# Patient Record
Sex: Male | Born: 1980 | Race: White | Hispanic: No | Marital: Married | Smoking: Former smoker
Health system: Southern US, Community
[De-identification: ages and names within clinical notes are randomized; demographics above are authoritative.]

## PROBLEM LIST (undated history)

## (undated) DIAGNOSIS — F329 Major depressive disorder, single episode, unspecified: Secondary | ICD-10-CM

## (undated) DIAGNOSIS — F419 Anxiety disorder, unspecified: Secondary | ICD-10-CM

## (undated) DIAGNOSIS — F32A Depression, unspecified: Secondary | ICD-10-CM

## (undated) DIAGNOSIS — J189 Pneumonia, unspecified organism: Secondary | ICD-10-CM

## (undated) DIAGNOSIS — IMO0002 Reserved for concepts with insufficient information to code with codable children: Secondary | ICD-10-CM

## (undated) HISTORY — PX: TONSILLECTOMY: SUR1361

## (undated) HISTORY — DX: Major depressive disorder, single episode, unspecified: F32.9

## (undated) HISTORY — PX: WISDOM TOOTH EXTRACTION: SHX21

## (undated) HISTORY — DX: Depression, unspecified: F32.A

---

## 2011-05-18 ENCOUNTER — Encounter (HOSPITAL_BASED_OUTPATIENT_CLINIC_OR_DEPARTMENT_OTHER): Payer: Self-pay | Admitting: Emergency Medicine

## 2011-05-18 ENCOUNTER — Emergency Department (HOSPITAL_BASED_OUTPATIENT_CLINIC_OR_DEPARTMENT_OTHER)
Admission: EM | Admit: 2011-05-18 | Discharge: 2011-05-18 | Disposition: A | Discharge status: Home / Self Care | Payer: Worker's Compensation | Attending: Emergency Medicine | Admitting: Emergency Medicine

## 2011-05-18 DIAGNOSIS — S335XXA Sprain of ligaments of lumbar spine, initial encounter: Secondary | ICD-10-CM | POA: Insufficient documentation

## 2011-05-18 DIAGNOSIS — X500XXA Overexertion from strenuous movement or load, initial encounter: Secondary | ICD-10-CM | POA: Insufficient documentation

## 2011-05-18 DIAGNOSIS — S39012A Strain of muscle, fascia and tendon of lower back, initial encounter: Secondary | ICD-10-CM

## 2011-05-18 DIAGNOSIS — Y9210 Unspecified residential institution as the place of occurrence of the external cause: Secondary | ICD-10-CM | POA: Insufficient documentation

## 2011-05-18 HISTORY — DX: Reserved for concepts with insufficient information to code with codable children: IMO0002

## 2011-05-18 MED ORDER — HYDROCODONE-ACETAMINOPHEN 5-500 MG PO TABS: 1.0000 | ORAL_TABLET | Freq: Four times a day (QID) | ORAL | Status: AC | PRN

## 2011-05-18 MED ORDER — IBUPROFEN 600 MG PO TABS: 600.0000 mg | ORAL_TABLET | Freq: Four times a day (QID) | ORAL | Status: AC | PRN

## 2011-05-18 MED ORDER — IBUPROFEN 400 MG PO TABS
600.0000 mg | ORAL_TABLET | Freq: Once | ORAL | Status: DC
  Filled 2011-05-18: qty 1

## 2011-05-18 NOTE — ED Notes (Signed)
Back pain after lifting a heavy pt  500+ lbs

## 2011-05-18 NOTE — ED Notes (Signed)
Pt states that he took 800mg  ibuprofen around 1:30 am

## 2011-05-18 NOTE — ED Provider Notes (Signed)
History     CSN: [id number]  Arrival date & time 05/18/11  0218   First MD Initiated Contact with Patient 05/18/11 0319      Chief Complaint  Patient presents with  . Back Pain    (Consider location/radiation/quality/duration/timing/severity/associated sxs/prior treatment) Patient is a 31 y.o. male presenting with back pain. The history is provided by the patient.  Back Pain  Pertinent negatives include no fever, no numbness and no weakness.  at work tonight, lifting heavy pt from stretcher, c/o low back soreness/stiffness developing in low back since lifting injury. No radicular pain. No leg numbness/weakness. No abd pain. No hx ddd or chronic back pain, although remote hx transverse process fx. No fever. No problems walking. No gu c/o.   Past Medical History  Diagnosis Date  . Back fracture   . Migraine     Past Surgical History  Procedure Date  . Tonsillectomy     Family History  Problem Relation Age of Onset  . Migraines Mother   . Hypertension Mother   . Stroke Mother   . Thyroid disease Father     History  Substance Use Topics  . Smoking status: Not on file  . Smokeless tobacco: Not on file  . Alcohol Use: No      Review of Systems  Constitutional: Negative for fever.  HENT: Negative for neck pain.   Musculoskeletal: Positive for back pain.  Neurological: Negative for weakness and numbness.    Allergies  Erythromycin and Grape seed  Home Medications  No current outpatient prescriptions on file.  BP 119/81  Pulse 84  Temp(Src) 98.6 F (37 C) (Oral)  Resp 18  Ht 6' (1.829 m)  Wt 174 lb (78.926 kg)  BMI 23.60 kg/m2  SpO2 99%  Physical Exam  Nursing note and vitals reviewed. Constitutional: He is oriented to person, place, and time. He appears well-developed and well-nourished. No distress.  HENT:  Head: Atraumatic.  Neck: Neck supple. No tracheal deviation present.  Cardiovascular: Normal rate.   Pulmonary/Chest: Effort normal. No  accessory muscle usage. No respiratory distress.  Abdominal: Soft. There is no tenderness.  Genitourinary:       No cva tenderness  Musculoskeletal: Normal range of motion.       Spine non tender, aligned, no step off. Lumbar musclar tenderness bil.   Neurological: He is alert and oriented to person, place, and time.       Motor intact bil. Straight leg raise neg. Steady gait.   Skin: Skin is warm and dry.  Psychiatric: He has a normal mood and affect.    ED Course  Procedures (including critical care time)    MDM  Motrin po. Spine nt. Exam c/w muscle strain. No radicular symptoms.         Suzi Roots, MD 05/18/11 [phone]

## 2011-05-18 NOTE — ED Notes (Signed)
MD at bedside. 

## 2011-05-18 NOTE — Discharge Instructions (Signed)
Take motrin as need. You may take vicodin as need for pain. No driving when taking vicodin. Also, do not take tylenol or acetaminophen containing medication when taking vicodin. Avoid bending at waist or heavy lifting more than 20 lbs for the next few days. Try heat/heating pad to sore area. Follow up with primary care doctor in coming week for recheck - discuss referral to back specialist and/or further workup if symptoms fail to improve/resolve. Return to ER if worse, leg numbness/weakness, intractable pain, other concern.   Lumbosacral Strain Lumbosacral strain is one of the most common causes of back pain. There are many causes of back pain. Most are not serious conditions. CAUSES  Your backbone (spinal column) is made up of 24 main vertebral bodies, the sacrum, and the coccyx. These are held together by muscles and tough, fibrous tissue (ligaments). Nerve roots pass through the openings between the vertebrae. A sudden move or injury to the back may cause injury to, or pressure on, these nerves. This may result in localized back pain or pain movement (radiation) into the buttocks, down the leg, and into the foot. Sharp, shooting pain from the buttock down the back of the leg (sciatica) is frequently associated with a ruptured (herniated) disk. Pain may be caused by muscle spasm alone. Your caregiver can often find the cause of your pain by the details of your symptoms and an exam. In some cases, you may need tests (such as X-rays). Your caregiver will work with you to decide if any tests are needed based on your specific exam. HOME CARE INSTRUCTIONS   Avoid an underactive lifestyle. Active exercise, as directed by your caregiver, is your greatest weapon against back pain.   Avoid hard physical activities (tennis, racquetball, waterskiing) if you are not in proper physical condition for it. This may aggravate or create problems.   If you have a back problem, avoid sports requiring sudden body  movements. Swimming and walking are generally safer activities.   Maintain good posture.   Avoid becoming overweight (obese).   Use bed rest for only the most extreme, sudden (acute) episode. Your caregiver will help you determine how much bed rest is necessary.   For acute conditions, you may put ice on the injured area.   Put ice in a plastic bag.   Place a towel between your skin and the bag.   Leave the ice on for 15 to 20 minutes at a time, every 2 hours, or as needed.   After you are improved and more active, it may help to apply heat for 30 minutes before activities.  See your caregiver if you are having pain that lasts longer than expected. Your caregiver can advise appropriate exercises or therapy if needed. With conditioning, most back problems can be avoided. SEEK IMMEDIATE MEDICAL CARE IF:   You have numbness, tingling, weakness, or problems with the use of your arms or legs.   You experience severe back pain not relieved with medicines.   There is a change in bowel or bladder control.   You have increasing pain in any area of the body, including your belly (abdomen).   You notice shortness of breath, dizziness, or feel faint.   You feel sick to your stomach (nauseous), are throwing up (vomiting), or become sweaty.   You notice discoloration of your toes or legs, or your feet get very cold.   Your back pain is getting worse.   You have a fever.  MAKE SURE YOU:  Understand these instructions.   Will watch your condition.   Will get help right away if you are not doing well or get worse.  Document Released: 12/13/2004 Document Revised: 11/15/2010 Document Reviewed: 06/04/2008 [facility] Patient Information 2012 [place], [place].

## 2011-05-27 ENCOUNTER — Encounter (HOSPITAL_COMMUNITY): Payer: Self-pay | Admitting: *Deleted

## 2011-05-27 ENCOUNTER — Emergency Department (HOSPITAL_COMMUNITY): Payer: 59

## 2011-05-27 ENCOUNTER — Emergency Department (HOSPITAL_COMMUNITY)
Admission: EM | Admit: 2011-05-27 | Discharge: 2011-05-27 | Disposition: A | Discharge status: Home / Self Care | Payer: 59 | Attending: Emergency Medicine | Admitting: Emergency Medicine

## 2011-05-27 DIAGNOSIS — R109 Unspecified abdominal pain: Secondary | ICD-10-CM | POA: Insufficient documentation

## 2011-05-27 DIAGNOSIS — R197 Diarrhea, unspecified: Secondary | ICD-10-CM | POA: Insufficient documentation

## 2011-05-27 DIAGNOSIS — R112 Nausea with vomiting, unspecified: Secondary | ICD-10-CM

## 2011-05-27 LAB — CBC
HCT: 42.9 % (ref 39.0–52.0)
Hemoglobin: 15 g/dL (ref 13.0–17.0)
RDW: 12.1 % (ref 11.5–15.5)
WBC: 10.2 10*3/uL (ref 4.0–10.5)

## 2011-05-27 LAB — LIPASE, BLOOD: Lipase: 30 U/L (ref 11–59)

## 2011-05-27 LAB — COMPREHENSIVE METABOLIC PANEL
ALT: 12 U/L (ref 0–53)
Albumin: 4.4 g/dL (ref 3.5–5.2)
Alkaline Phosphatase: 80 U/L (ref 39–117)
BUN: 14 mg/dL (ref 6–23)
Chloride: 102 mEq/L (ref 96–112)
Potassium: 3.8 mEq/L (ref 3.5–5.1)
Sodium: 139 mEq/L (ref 135–145)
Total Bilirubin: 0.5 mg/dL (ref 0.3–1.2)

## 2011-05-27 MED ORDER — ONDANSETRON HCL 4 MG/2ML IJ SOLN
4.0000 mg | Freq: Once | INTRAMUSCULAR | Status: AC
  Administered 2011-05-27: 4 mg via INTRAVENOUS

## 2011-05-27 MED ORDER — PROMETHAZINE HCL 25 MG/ML IJ SOLN
25.0000 mg | Freq: Once | INTRAMUSCULAR | Status: AC
  Administered 2011-05-27: 25 mg via INTRAVENOUS
  Filled 2011-05-27: qty 1

## 2011-05-27 MED ORDER — PROMETHAZINE HCL 25 MG PO TABS: 25.0000 mg | ORAL_TABLET | Freq: Four times a day (QID) | ORAL | Status: DC | PRN

## 2011-05-27 MED ORDER — ONDANSETRON HCL 4 MG/2ML IJ SOLN: 4.0000 mg | INTRAMUSCULAR | Status: DC | PRN

## 2011-05-27 MED ORDER — ONDANSETRON HCL 4 MG/2ML IJ SOLN
INTRAMUSCULAR | Status: AC
  Filled 2011-05-27: qty 2

## 2011-05-27 MED ORDER — FAMOTIDINE IN NACL 20-0.9 MG/50ML-% IV SOLN
20.0000 mg | Freq: Once | INTRAVENOUS | Status: AC
  Administered 2011-05-27: 20 mg via INTRAVENOUS
  Filled 2011-05-27: qty 50

## 2011-05-27 MED ORDER — SODIUM CHLORIDE 0.9 % IV BOLUS (SEPSIS)
500.0000 mL | Freq: Once | INTRAVENOUS | Status: AC
  Administered 2011-05-27: 500 mL via INTRAVENOUS

## 2011-05-27 MED ORDER — IOHEXOL 300 MG/ML  SOLN
100.0000 mL | Freq: Once | INTRAMUSCULAR | Status: AC | PRN
  Administered 2011-05-27: 100 mL via INTRAVENOUS

## 2011-05-27 MED ORDER — SODIUM CHLORIDE 0.9 % IV SOLN
INTRAVENOUS | Status: DC
  Administered 2011-05-27: 150 mL via INTRAVENOUS

## 2011-05-27 MED ORDER — ACETAMINOPHEN 325 MG PO TABS
650.0000 mg | ORAL_TABLET | Freq: Once | ORAL | Status: AC
  Administered 2011-05-27: 650 mg via ORAL
  Filled 2011-05-27: qty 2

## 2011-05-27 MED ORDER — SODIUM CHLORIDE 0.9 % IV BOLUS (SEPSIS)
1000.0000 mL | Freq: Once | INTRAVENOUS | Status: AC
  Administered 2011-05-27: 1000 mL via INTRAVENOUS

## 2011-05-27 NOTE — Discharge Instructions (Signed)
RESOURCE GUIDE  Dental Problems  Patients with Medicaid: [facility]                     [facility] [address]                                           [address] Phone:  [phone]                                                  Phone:  [phone]  If unable to pay or uninsured, contact:  [facility] or [facility] to become qualified for the adult dental clinic.  Chronic Pain Problems Contact [facility]  [phone] Patients need to be referred by their primary care doctor.  Insufficient Money for Medicine Contact [employer]:  call "211" or [facility] 271-5999.  No Primary Care Doctor Call [facility]  832-8000 Other agencies that provide inexpensive medical care    [facility]  [phone]    [facility]  [phone]    [facility]  [phone]    Women[facility] Clinic  [phone]    [facility]  [phone]    [facility]  [phone]  Psychological Services [facility]  [phone] [facility]  [phone] [facility]   800 853-5163 (emergency services 641-4993)  Substance Abuse Resources [facility]  336-882-2125 [facility] 336-784-9470 [facility] 336-285-9073 [facility] 336-845-3988 Residential & Outpatient Substance Abuse Program  800-659-3381  Abuse/Neglect [place] Child Abuse Hotline (336) 641-3795 [place] Child Abuse Hotline 800-378-5315 (After Hours)  Emergency Shelter [facility] (336) 271-5985  Maternity Homes [facility] (336) 275-9566 [facility] (704) 372-4663  MRSA Hotline #:   832-7006    [place] [facility]  [facility] [place] [facility]     [employer]                          [facility] [address] [place]                       [address]      [address]  [place]                                                [place]                            [place] Phone:  [phone]                                   Phone:  [phone]                 Phone:  [phone]  [facility] Phone:  [phone]    [facility] Child Abuse Hotline (209)042-1831 551-479-4395 (After Hours)   Take the prescriptions as directed.  Increase your fluid intake (ie:  Gatoraide) for the next few days, as discussed.  Eat a bland diet and advance to your regular diet slowly as you can tolerate it.   Avoid full strength juices, as well as milk and milk products until your diarrhea has resolved.   Take over the counter extra strength tylenol, as directed on packaging, as needed for discomfort or fever.  Call your regular medical doctor tomorrow morning to schedule a follow up appointment this week.  Return to the Emergency Department immediately if not improving (or even worsening) despite taking the medicines as prescribed, any black or bloody stool or vomit, or for any other concerns.

## 2011-05-27 NOTE — ED Notes (Signed)
Pt to CT and returns, now back in bed sleeping

## 2011-05-27 NOTE — ED Provider Notes (Signed)
History     CSN: [id number]  Arrival date & time 05/27/11  0544   First MD Initiated Contact with Patient 05/27/11 [phone]      Chief Complaint  Patient presents with  . Nausea     HPI Pt was seen at 0715.  Per pt, c/o gradual onset and persistence of multiple intermittent episodes of N/V that began PTA.  Has been assoc with generalized abd "pain."  Denies fevers, no diarrhea, no rash, no back pain, no black or blood in stools or emesis.   Past Medical History  Diagnosis Date  . Back fracture   . Migraine     Past Surgical History  Procedure Date  . Tonsillectomy     Family History  Problem Relation Age of Onset  . Migraines Mother   . Hypertension Mother   . Stroke Mother   . Thyroid disease Father     History  Substance Use Topics  . Smoking status: Not on file  . Smokeless tobacco: Not on file  . Alcohol Use: No    Review of Systems ROS: Statement: All systems negative except as marked or noted in the HPI; Constitutional: Negative for fever and chills. ; ; Eyes: Negative for eye pain, redness and discharge. ; ; ENMT: Negative for ear pain, hoarseness, nasal congestion, sinus pressure and sore throat. ; ; Cardiovascular: Negative for chest pain, palpitations, diaphoresis, dyspnea and peripheral edema. ; ; Respiratory: Negative for cough, wheezing and stridor. ; ; Gastrointestinal: +abd pain, N/V.  Negative for diarrhea, blood in stool, hematemesis, jaundice and rectal bleeding. . ; ; Genitourinary: Negative for dysuria, flank pain and hematuria. ; ; Musculoskeletal: Negative for back pain and neck pain. Negative for swelling and trauma.; ; Skin: Negative for pruritus, rash, abrasions, blisters, bruising and skin lesion.; ; Neuro: Negative for headache, lightheadedness and neck stiffness. Negative for weakness, altered level of consciousness , altered mental status, extremity weakness, paresthesias, involuntary movement, seizure and syncope.     Allergies  Erythromycin  and Grape seed  Home Medications   Current Outpatient Rx  Name Route Sig Dispense Refill  . HYDROCODONE-ACETAMINOPHEN 5-500 MG PO TABS Oral Take 1-2 tablets by mouth every 6 (six) hours as needed for pain. 15 tablet 0  . IBUPROFEN 600 MG PO TABS Oral Take 1 tablet (600 mg total) by mouth every 6 (six) hours as needed for pain. 20 tablet 0    BP 113/45  Pulse 107  Temp(Src) 99.7 F (37.6 C) (Oral)  Resp 20  SpO2 95%  Physical Exam 0720: Physical examination:  Nursing notes reviewed; Vital signs and O2 SAT reviewed;  Constitutional: Well developed, Well nourished, Well hydrated, In no acute distress; Head:  Normocephalic, atraumatic; Eyes: EOMI, PERRL, No scleral icterus; ENMT: Mouth and pharynx normal, Mucous membranes moist; Neck: Supple, Full range of motion, No lymphadenopathy; Cardiovascular: Regular rate and rhythm, No murmur, rub, or gallop; Respiratory: Breath sounds clear & equal bilaterally, No rales, rhonchi, wheezes, or rub, Normal respiratory effort/excursion; Chest: Nontender, Movement normal; Abdomen: Soft, +diffuse tenderness to palp esp mid-epigastric/LUQ abd, Nondistended, Normal bowel sounds; Extremities: Pulses normal, No tenderness, No edema, No calf edema or asymmetry.; Neuro: AA&Ox3, Major CN grossly intact.  No gross focal motor or sensory deficits in extremities.; Skin: Color normal, Warm, Dry, no rash.    ED Course  Procedures   MDM  MDM Reviewed: nursing note and vitals Interpretation: labs and CT scan   Results for orders placed during the hospital encounter of 05/27/11  CBC      Component Value Range   WBC 10.2  4.0 - 10.5 (K/uL)   RBC 4.90  4.22 - 5.81 (MIL/uL)   Hemoglobin 15.0  13.0 - 17.0 (g/dL)   HCT 14.7  82.9 - 56.2 (%)   MCV 87.6  78.0 - 100.0 (fL)   MCH 30.6  26.0 - 34.0 (pg)   MCHC 35.0  30.0 - 36.0 (g/dL)   RDW 13.0  86.5 - 78.4 (%)   Platelets 246  150 - 400 (K/uL)  COMPREHENSIVE METABOLIC PANEL      Component Value Range   Sodium  139  135 - 145 (mEq/L)   Potassium 3.8  3.5 - 5.1 (mEq/L)   Chloride 102  96 - 112 (mEq/L)   CO2 29  19 - 32 (mEq/L)   Glucose, Bld 116 (*) 70 - 99 (mg/dL)   BUN 14  6 - 23 (mg/dL)   Creatinine, Ser 6.96  0.50 - 1.35 (mg/dL)   Calcium 8.9  8.4 - 29.5 (mg/dL)   Total Protein 7.2  6.0 - 8.3 (g/dL)   Albumin 4.4  3.5 - 5.2 (g/dL)   AST 14  0 - 37 (U/L)   ALT 12  0 - 53 (U/L)   Alkaline Phosphatase 80  39 - 117 (U/L)   Total Bilirubin 0.5  0.3 - 1.2 (mg/dL)   GFR calc non Af Amer >90  >90 (mL/min)   GFR calc Af Amer >90  >90 (mL/min)  LIPASE, BLOOD      Component Value Range   Lipase 30  11 - 59 (U/L)   Ct Abdomen Pelvis W Contrast 05/27/2011  *RADIOLOGY REPORT*  Clinical Data: Nausea, vomiting and abdominal pain.  CT ABDOMEN AND PELVIS WITH CONTRAST  Technique:  Multidetector CT imaging of the abdomen and pelvis was performed following the standard protocol during bolus administration of intravenous contrast.  Contrast: OMNIPAQUE IOHEXOL 300 MG/ML IJ SOLN  Comparison: None.  Findings: Nonspecific fluid in multiple nondilated small bowel loops in the proximal half of the colon.  This may reflect some type of underlying enteritis.  No associated bowel wall thickening, perforation, free fluid or abscess.  The liver, gallbladder, pancreas, spleen, adrenal glands and kidneys are within normal limits.  No masses or enlarged lymph nodes are identified.  No hernias or abnormal calcifications.  The bladder is moderately distended and unremarkable in appearance.  No bony abnormalities.  IMPRESSION: The only finding by CT is nonspecific fluid in nondilated small bowel and colon.  This may be reflective of underlying enteritis.  Original Report Authenticated By: Reola Calkins, M.D.     11:52 AM:   States he still feels "groggy" from the phenergan dose given earlier.  Fever tx with APAP.  Pt has tol PO well while in ED without N/V.  No stooling while in ED.  Pt wants to go home now.  Appears viral  illness at this time, as pt does not have hx of exposure to contaminated water/foods, recent travel, recent abx, or bloody stools.  Call to Rads MD to further discuss CT above: confirms that there is NOT dilated nor bowel wall thickening, only increased amount of fluid in the small bowel, which is a non-specific finding and generally seen with gastroenteritis.  Dx testing d/w pt.  Questions answered.  Verb understanding, agreeable to d/c home with outpt f/u.            Laray Anger, DO 05/28/11 1219

## 2011-05-27 NOTE — ED Notes (Signed)
[id number]<EX> Expected date:<BR> Expected time:<BR> Means of arrival:<BR> Comments:<BR> EMS, EMS

## 2011-05-27 NOTE — ED Notes (Signed)
Pt started vomiting x 30 minutes prior to arrival.  Pt has vomited x 3.

## 2011-05-27 NOTE — ED Notes (Signed)
Put patient into gown.

## 2013-04-26 ENCOUNTER — Emergency Department (HOSPITAL_COMMUNITY)
Admission: EM | Admit: 2013-04-26 | Discharge: 2013-04-26 | Disposition: A | Discharge status: Home / Self Care | Payer: Worker's Compensation | Attending: Emergency Medicine | Admitting: Emergency Medicine

## 2013-04-26 ENCOUNTER — Encounter (HOSPITAL_COMMUNITY): Payer: Self-pay | Admitting: Emergency Medicine

## 2013-04-26 DIAGNOSIS — Z8781 Personal history of (healed) traumatic fracture: Secondary | ICD-10-CM | POA: Diagnosis not present

## 2013-04-26 DIAGNOSIS — G43909 Migraine, unspecified, not intractable, without status migrainosus: Secondary | ICD-10-CM | POA: Diagnosis not present

## 2013-04-26 DIAGNOSIS — Y9210 Unspecified residential institution as the place of occurrence of the external cause: Secondary | ICD-10-CM | POA: Diagnosis not present

## 2013-04-26 DIAGNOSIS — Y9389 Activity, other specified: Secondary | ICD-10-CM | POA: Diagnosis not present

## 2013-04-26 DIAGNOSIS — Z79899 Other long term (current) drug therapy: Secondary | ICD-10-CM | POA: Insufficient documentation

## 2013-04-26 DIAGNOSIS — S51809A Unspecified open wound of unspecified forearm, initial encounter: Secondary | ICD-10-CM | POA: Insufficient documentation

## 2013-04-26 DIAGNOSIS — Z87891 Personal history of nicotine dependence: Secondary | ICD-10-CM | POA: Diagnosis not present

## 2013-04-26 DIAGNOSIS — Z7721 Contact with and (suspected) exposure to potentially hazardous body fluids: Secondary | ICD-10-CM | POA: Diagnosis present

## 2013-04-26 DIAGNOSIS — Y990 Civilian activity done for income or pay: Secondary | ICD-10-CM | POA: Insufficient documentation

## 2013-04-26 NOTE — Discharge Instructions (Signed)
Body Fluid Exposure Information  People may come into contact with blood and other body fluids under various circumstances. In some cases, body fluids may contain germs (bacteria or viruses) that cause infections. These germs can be spread when another person's body fluids come into contact with your skin, mouth, eyes, or genitals.   Exposure to body fluids that may contain infectious material is a common problem for people providing care for others who are ill. It can occur when a person is performing health care tasks in the workplace or when taking care of a family member at home. Other common methods of exposure include injection drug use, sharing needles, and sexual activity.  The risk of an infection spreading through body fluid exposure is small and depends on a variety of factors. This includes the type of body fluid, the nature of the exposure, and the health status of the person who was the source of the body fluids. Your health care provider can help you assess the risk.  WHAT TYPES OF BODY FLUID CAN SPREAD INFECTION?  The following types of body fluid have the potential to spread infections:  · Blood.  · Semen.  · Vaginal secretions.  · Urine.  · Feces.  · Saliva.  · Nasal or eye discharge.  · Breast milk.  · Amniotic fluid and fluids surrounding body organs.  WHAT ARE SOME FIRST-AID MEASURES FOR BODY FLUID EXPOSURE?  The following steps should be taken as soon as possible after a person is exposed to body fluids:  Intact Skin  · For contact with closed skin, wash the area with soap and water.  Broken Skin  · For contact with broken skin (a wound), wash the area with soap and water. Let the area bleed a little. Then place a bandage or clean towel on the wound, applying gentle pressure to stop the bleeding. Do not squeeze or rub the area.  · Use just water or hand sanitizer if a sink with soap is not available.  · Do not use harsh chemicals such as bleach or iodine.  Eyes  · Rinse the eyes with water or  saline for 30 seconds.  · If the person is wearing contact lenses, leave the contact lenses in while rinsing the eyes. Once the rinsing is complete, remove the contact lenses.  Mouth  · Spit out the fluids. Rinse and spit with water 4 5 times.  In addition, you should remove any clothing that comes into contact with body fluids. However, if body fluid exposure results from sexual assault, seek medical care immediately without changing clothes or bathing.  WHEN SHOULD YOU SEEK HELP?  After performing the proper first-aid steps, you should contact your health care provider or seek emergency care right away if blood or other body fluids made contact with areas of broken skin or openings such as the eyes or mouth. If the exposure to body fluid happened in the workplace, you should report it to your work supervisor immediately. Many workplaces have procedures in place for exposure situations.  WHAT WILL HAPPEN AFTER YOU REPORT THE EXPOSURE?  Your health care provider will ask you several questions. Information requested may include:  · Your medical history, including vaccination records.  · Date and time of the exposure.  · Whether you saw body fluids during the exposure.  · Type of body fluid you were exposed to.  · Volume of body fluid you were exposed to.  · How the exposure happened.  · If any   devices, such as a needle, were being used.  · Which area of your body made contact with the body fluid.  · Description of any injury to the skin or other area.  · How long contact was made with the body fluid.  · Any information you have about the health status of the person whose body fluid you were exposed to.  The health care provider will assess your risk of infection. Often, no treatment is necessary. In some cases, the health care provider may recommend doing blood tests right away. Follow-up blood tests may also be done at certain intervals during the upcoming weeks and months to check for changes. You may be offered  treatment to prevent an infection from developing after exposure (post-exposure prophylaxis, PEP). This may include certain vaccinations or medicines and may be necessary when there is a risk of a serious infection, such as HIV or hepatitis B. Your health care provider should discuss appropriate treatment and vaccinations with you.  HOW CAN YOU PREVENT EXPOSURE AND INFECTION?  Always remember that prevention is the first line of defense against body fluid exposure. To help prevent exposure to body fluids:  · Wash and disinfect countertops and other surfaces regularly.  · Wear appropriate protective gear such as gloves, gowns, or eyewear when the possibility of exposure is present.  · Wipe away spills of body fluid with disposable towels.  · Properly dispose of blood products and other fluids. Use secured bags.  · Properly dispose of needles and other instruments with sharp points or edges (sharps). Use closed, marked containers.  · Avoid injection drug use.  · Do not share needles.  · Avoid recapping needles.  · Use a condom during sexual intercourse.  · Make sure you learn and follow any guidelines for preventing exposure (universal precautions) provided at your workplace.  To help reduce your chances of getting an infection:  · Make sure your vaccinations are up-to-date, including those for tetanus and hepatitis.  · Wash your hands frequently with soap and water. Use hand sanitizers.  · Avoid having multiple sex partners.  · Follow up with your health care provider as directed after being evaluated for an exposure to body fluids.  To avoid spreading infection to others:  · Do not have sexual relations until you know you are free of infection.  · Do not donate blood, plasma, breast milk, sperm, or other body fluids.  · Do not share hygiene tools such as toothbrushes, razors, or dental floss.  · Keep open wounds covered.  · Dispose of any items with blood on them (razors, tampons, bandages) by putting them in the  trash.  · Do not share drug supplies with others, such as needles, syringes, straws, or pipes.  · Follow all of your health care provider's instructions for preventing the spread of infection.  Document Released: 11/05/2012 Document Reviewed: 11/05/2012  [employer]® Patient Information ©2014 [employer].

## 2013-04-26 NOTE — ED Provider Notes (Signed)
CSN: [id number]     Arrival date & time 04/26/13  2117 History   First MD Initiated Contact with Patient 04/26/13 2147     Chief Complaint  Patient presents with  . Body Fluid Exposure   (Consider location/radiation/quality/duration/timing/severity/associated sxs/prior Treatment) HPI Comments: Patient is 33 year old [address] [employer] employee who while bringing in a combative patient got scratched on his right forearm by the patient.  He then states that they lost the IV and while pulling it the patient again became combative and then bled over the scratches on his arm.  He washed the area immediately with soap and water.  The source patient is available here for testing.    The history is provided by the patient. No language interpreter was used.    Past Medical History  Diagnosis Date  . Back fracture   . Migraine    Past Surgical History  Procedure Laterality Date  . Tonsillectomy     Family History  Problem Relation Age of Onset  . Migraines Mother   . Hypertension Mother   . Stroke Mother   . Thyroid disease Father    History  Substance Use Topics  . Smoking status: Former Smoker -- 15 years  . Smokeless tobacco: Not on file  . Alcohol Use: No    Review of Systems  All other systems reviewed and are negative.    Allergies  Grape seed and Erythromycin  Home Medications   Current Outpatient Rx  Name  Route  Sig  Dispense  Refill  . aspirin-acetaminophen-caffeine (EXCEDRIN MIGRAINE) 250-250-65 MG per tablet   Oral   Take 2 tablets by mouth every 6 (six) hours as needed for headache.         . loratadine (CLARITIN) 10 MG tablet   Oral   Take 10 mg by mouth daily as needed for allergies.         . promethazine (PHENERGAN) 25 MG tablet   Oral   Take 25 mg by mouth every 6 (six) hours as needed for nausea or vomiting.         Marland Kitchen tetrahydrozoline 0.05 % ophthalmic solution   Both Eyes   Place 2 drops into both eyes daily as needed (for dry eyes).         . EXPIRED: promethazine (PHENERGAN) 25 MG tablet   Oral   Take 1 tablet (25 mg total) by mouth every 6 (six) hours as needed for nausea.   8 tablet   0    BP 144/87  Pulse 115  Temp(Src) 98.3 F (36.8 C) (Oral)  Resp 18  SpO2 96% Physical Exam  Nursing note and vitals reviewed. Constitutional: He is oriented to person, place, and time. He appears well-developed and well-nourished. No distress.  HENT:  Head: Normocephalic and atraumatic.  Right Ear: External ear normal.  Left Ear: External ear normal.  Nose: Nose normal.  Mouth/Throat: Oropharynx is clear and moist. No oropharyngeal exudate.  Eyes: Conjunctivae are normal. Pupils are equal, round, and reactive to light. No scleral icterus.  Neck: Normal range of motion. Neck supple.  Cardiovascular: Normal rate, regular rhythm and normal heart sounds.  Exam reveals no gallop and no friction rub.   No murmur heard. Pulmonary/Chest: Effort normal and breath sounds normal. No respiratory distress. He has no wheezes. He has no rales. He exhibits no tenderness.  Abdominal: Soft. Bowel sounds are normal. He exhibits no distension. There is no tenderness.  Musculoskeletal: Normal range of motion. He  exhibits no edema and no tenderness.  Lymphadenopathy:    He has no cervical adenopathy.  Neurological: He is alert and oriented to person, place, and time. He exhibits normal muscle tone. Coordination normal.  Skin: Skin is warm and dry. No rash noted. There is erythema. No pallor.  Three small scratch marks to right forearm, hemostatic, no drainage.  Psychiatric: He has a normal mood and affect. His behavior is normal. Judgment and thought content normal.    ED Course  Procedures (including critical care time) Labs Review Labs Reviewed - No data to display Imaging Review No results found.  EKG Interpretation   None      Results for orders placed during the hospital encounter of 05/27/11  CBC      Result Value Range    WBC 10.2  4.0 - 10.5 K/uL   RBC 4.90  4.22 - 5.81 MIL/uL   Hemoglobin 15.0  13.0 - 17.0 g/dL   HCT 16.142.9  09.639.0 - 04.552.0 %   MCV 87.6  78.0 - 100.0 fL   MCH 30.6  26.0 - 34.0 pg   MCHC 35.0  30.0 - 36.0 g/dL   RDW 40.912.1  81.111.5 - 91.415.5 %   Platelets 246  150 - 400 K/uL  COMPREHENSIVE METABOLIC PANEL      Result Value Range   Sodium 139  135 - 145 mEq/L   Potassium 3.8  3.5 - 5.1 mEq/L   Chloride 102  96 - 112 mEq/L   CO2 29  19 - 32 mEq/L   Glucose, Bld 116 (*) 70 - 99 mg/dL   BUN 14  6 - 23 mg/dL   Creatinine, Ser 7.820.98  0.50 - 1.35 mg/dL   Calcium 8.9  8.4 - 95.610.5 mg/dL   Total Protein 7.2  6.0 - 8.3 g/dL   Albumin 4.4  3.5 - 5.2 g/dL   AST 14  0 - 37 U/L   ALT 12  0 - 53 U/L   Alkaline Phosphatase 80  39 - 117 U/L   Total Bilirubin 0.5  0.3 - 1.2 mg/dL   GFR calc non Af Amer >90  >90 mL/min   GFR calc Af Amer >90  >90 mL/min  LIPASE, BLOOD      Result Value Range   Lipase 30  11 - 59 U/L   No results found.   MDM  Blood and body fluid exposure  Patient here with [facility] s/p blood exposure to open wound.  Rapid HIV results on source patient is negative and the patient does not have risk factors for HIV.  Based on the protocol the patient here will not be given prophylaxis medications but will follow up with their occupational health services [facility].  Izola PriceFrances C. Marisue HumbleSanford, PA-C 04/26/13 2249

## 2013-04-26 NOTE — ED Notes (Signed)
Patient with abraision to right forearm during EMS call.  Patient blood came into contact with non intact skin.

## 2013-04-27 NOTE — ED Provider Notes (Signed)
Medical screening examination/treatment/procedure(s) were performed by non-physician practitioner and as supervising physician I was immediately available for consultation/collaboration.  EKG Interpretation   None         Junius ArgyleForrest S Skylarr Liz, MD 04/27/13 1347

## 2013-10-21 ENCOUNTER — Ambulatory Visit (INDEPENDENT_AMBULATORY_CARE_PROVIDER_SITE_OTHER): Payer: 59 | Admitting: Surgery

## 2014-07-08 ENCOUNTER — Emergency Department (HOSPITAL_COMMUNITY): Payer: BLUE CROSS/BLUE SHIELD

## 2014-07-08 ENCOUNTER — Encounter (HOSPITAL_COMMUNITY): Payer: Self-pay | Admitting: Emergency Medicine

## 2014-07-08 ENCOUNTER — Emergency Department (HOSPITAL_COMMUNITY)
Admission: EM | Admit: 2014-07-08 | Discharge: 2014-07-08 | Disposition: A | Discharge status: Home / Self Care | Payer: BLUE CROSS/BLUE SHIELD | Attending: Emergency Medicine | Admitting: Emergency Medicine

## 2014-07-08 DIAGNOSIS — W230XXA Caught, crushed, jammed, or pinched between moving objects, initial encounter: Secondary | ICD-10-CM | POA: Diagnosis not present

## 2014-07-08 DIAGNOSIS — G43909 Migraine, unspecified, not intractable, without status migrainosus: Secondary | ICD-10-CM | POA: Diagnosis not present

## 2014-07-08 DIAGNOSIS — S61212A Laceration without foreign body of right middle finger without damage to nail, initial encounter: Secondary | ICD-10-CM

## 2014-07-08 DIAGNOSIS — Z8781 Personal history of (healed) traumatic fracture: Secondary | ICD-10-CM | POA: Insufficient documentation

## 2014-07-08 DIAGNOSIS — Z87891 Personal history of nicotine dependence: Secondary | ICD-10-CM | POA: Diagnosis not present

## 2014-07-08 DIAGNOSIS — Y9389 Activity, other specified: Secondary | ICD-10-CM | POA: Insufficient documentation

## 2014-07-08 DIAGNOSIS — Z23 Encounter for immunization: Secondary | ICD-10-CM | POA: Insufficient documentation

## 2014-07-08 DIAGNOSIS — Y998 Other external cause status: Secondary | ICD-10-CM | POA: Insufficient documentation

## 2014-07-08 DIAGNOSIS — Y92810 Car as the place of occurrence of the external cause: Secondary | ICD-10-CM | POA: Insufficient documentation

## 2014-07-08 DIAGNOSIS — S6991XA Unspecified injury of right wrist, hand and finger(s), initial encounter: Secondary | ICD-10-CM | POA: Diagnosis present

## 2014-07-08 MED ORDER — LIDOCAINE HCL (PF) 1 % IJ SOLN
5.0000 mL | Freq: Once | INTRAMUSCULAR | Status: AC
  Administered 2014-07-08: 5 mL via INTRADERMAL
  Filled 2014-07-08: qty 5

## 2014-07-08 MED ORDER — IBUPROFEN 400 MG PO TABS
800.0000 mg | ORAL_TABLET | Freq: Once | ORAL | Status: AC
  Administered 2014-07-08: 800 mg via ORAL
  Filled 2014-07-08: qty 2

## 2014-07-08 MED ORDER — TETANUS-DIPHTH-ACELL PERTUSSIS 5-2.5-18.5 LF-MCG/0.5 IM SUSP
0.5000 mL | Freq: Once | INTRAMUSCULAR | Status: AC
  Administered 2014-07-08: 0.5 mL via INTRAMUSCULAR
  Filled 2014-07-08: qty 0.5

## 2014-07-08 NOTE — ED Notes (Signed)
Pt. accidentally injured his right distal middle finger against the car door this evening , presents with laceration and swelling at distal tip of right middle finger .

## 2014-07-08 NOTE — ED Provider Notes (Signed)
CSN: [id number]     Arrival date & time 07/08/14  2137 History  This chart was scribed for non-physician practitioner, Dierdre ForthHannah Joshwa Hemric, PA-C, working with Linwood DibblesJon Knapp, MD, by Bronson CurbJacqueline Melvin, ED Scribe. This patient was seen in room TR09C/TR09C and the patient's care was started at 9:53 PM.    Chief Complaint  Patient presents with  . Finger Injury    The history is provided by the patient and medical records. No language interpreter was used.     HPI Comments: Cody Gutierrez is a 34 y.o. male who presents to the Emergency Department complaining of right middle finger injury that occurred PTA. Patient states he accidentally slammed his finger in a car door this evening. There is an associated laceration with swelling and numbness to the tip of the affected finger. Bleeding is controlled at this time. He denies any other injuries. Patient is unsure of the status of tetanus.   Past Medical History  Diagnosis Date  . Back fracture   . Migraine    Past Surgical History  Procedure Laterality Date  . Tonsillectomy     Family History  Problem Relation Age of Onset  . Migraines Mother   . Hypertension Mother   . Stroke Mother   . Thyroid disease Father    History  Substance Use Topics  . Smoking status: Former Smoker -- 15 years  . Smokeless tobacco: Not on file  . Alcohol Use: No    Review of Systems  Constitutional: Negative for fever.  Gastrointestinal: Negative for nausea and vomiting.  Musculoskeletal: Positive for myalgias.  Skin: Positive for wound (laceration).  Allergic/Immunologic: Negative for immunocompromised state.  Neurological: Positive for numbness. Negative for weakness.  Hematological: Does not bruise/bleed easily.  Psychiatric/Behavioral: The patient is not nervous/anxious.       Allergies  Grape seed and Erythromycin  Home Medications   Prior to Admission medications   Medication Sig Start Date End Date Taking? Authorizing Provider   aspirin-acetaminophen-caffeine (EXCEDRIN MIGRAINE) [phone] [id number] MG per tablet Take 2 tablets by mouth every 6 (six) hours as needed for headache.    Historical Provider, MD  loratadine (CLARITIN) 10 MG tablet Take 10 mg by mouth daily as needed for allergies.    Historical Provider, MD  promethazine (PHENERGAN) 25 MG tablet Take 1 tablet (25 mg total) by mouth every 6 (six) hours as needed for nausea. 05/27/11 06/03/11  Samuel JesterKathleen McManus, DO  promethazine (PHENERGAN) 25 MG tablet Take 25 mg by mouth every 6 (six) hours as needed for nausea or vomiting.    Historical Provider, MD  tetrahydrozoline 0.05 % ophthalmic solution Place 2 drops into both eyes daily as needed (for dry eyes).    Historical Provider, MD   Triage Vitals: BP 140/74 mmHg  Pulse 96  Temp(Src) 97.7 F (36.5 C)  Resp 20  SpO2 97%  Physical Exam  Constitutional: He is oriented to person, place, and time. He appears well-developed and well-nourished. No distress.  HENT:  Head: Normocephalic and atraumatic.  Eyes: Conjunctivae are normal. No scleral icterus.  Neck: Normal range of motion.  Cardiovascular: Normal rate, regular rhythm, normal heart sounds and intact distal pulses.   No murmur heard. Capillary refill < 3 sec  Pulmonary/Chest: Effort normal and breath sounds normal. No respiratory distress.  Musculoskeletal: Normal range of motion. He exhibits no edema.  ROM: Full ROM of all fingers on the right hand.  1.5 cm laceration to the lateral aspect of the distal 3rd finger. No evidence of nail  bed laceration.  Neurological: He is alert and oriented to person, place, and time.  Sensation: Decreased at the tip, but present Strength: 5/5 including resisted flexion and extension of the PIP, DIP, MCP of the 3rd finger.   Skin: Skin is warm and dry. He is not diaphoretic.  Psychiatric: He has a normal mood and affect.  Nursing note and vitals reviewed.   ED Course  LACERATION REPAIR Date/Time: 07/08/2014 10:57  PM Performed by: Dierdre Forth Authorized by: Dierdre Forth Consent: Verbal consent obtained. Risks and benefits: risks, benefits and alternatives were discussed Consent given by: patient Patient understanding: patient states understanding of the procedure being performed Patient consent: the patient's understanding of the procedure matches consent given Procedure consent: procedure consent matches procedure scheduled Relevant documents: relevant documents present and verified Site marked: the operative site was marked Required items: required blood products, implants, devices, and special equipment available Patient identity confirmed: arm band and verbally with patient Time out: Immediately prior to procedure a "time out" was called to verify the correct patient, procedure, equipment, support staff and site/side marked as required. Body area: upper extremity Location details: right long finger Laceration length: 1.5 cm Foreign bodies: no foreign bodies Tendon involvement: none Nerve involvement: none Vascular damage: no Anesthesia: digital block Anesthetic total: 4 ml Patient sedated: no Preparation: Patient was prepped and draped in the usual sterile fashion. Irrigation solution: saline Irrigation method: syringe Amount of cleaning: extensive Debridement: none Degree of undermining: none Skin closure: 5-0 Prolene Number of sutures: 3 Technique: simple Approximation: close Dressing: 4x4 sterile gauze Patient tolerance: Patient tolerated the procedure well with no immediate complications   (including critical care time)  DIAGNOSTIC STUDIES: Oxygen Saturation is 97% on room air, adequate by my interpretation.    COORDINATION OF CARE: At 2155 Discussed treatment plan with patient which includes ibuprofen, imaging, and laceration repair. Patient agrees.    Labs Review Labs Reviewed - No data to display  Imaging Review Dg Finger Middle Right  07/08/2014    CLINICAL DATA:  Jammed third finger in car door.  EXAM: RIGHT MIDDLE FINGER 2+V  COMPARISON:  None.  FINDINGS: No fracture or dislocation. Joint spaces are preserved. No erosions. Regional soft tissues appear normal. No radiopaque foreign body.  IMPRESSION: No fracture, dislocation or radiopaque foreign body.   Electronically Signed   By: Simonne Come M.D.   On: 07/08/2014 22:35     EKG Interpretation None      MDM   Final diagnoses:  Laceration of right middle finger w/o foreign body w/o damage to nail, initial encounter   Cody Gutierrez presents with laceration to the right long finger.  Tdap booster given. Pressure irrigation performed. Laceration occurred < 8 hours prior to repair which was well tolerated. Pt has no co morbidities to effect normal wound healing. Discussed suture home care w pt and answered questions. Pt to f-u for wound check and suture removal in 7 days. Pt is hemodynamically stable w no complaints prior to dc.    BP 140/74 mmHg  Pulse 96  Temp(Src) 97.7 F (36.5 C)  Resp 20  SpO2 97%  I personally performed the services described in this documentation, which was scribed in my presence. The recorded information has been reviewed and is accurate.    Dahlia Client Damien Batty, PA-C 07/08/14 2313  Linwood Dibbles, MD 07/08/14 [phone]

## 2014-07-08 NOTE — Discharge Instructions (Signed)

## 2014-07-16 ENCOUNTER — Ambulatory Visit (INDEPENDENT_AMBULATORY_CARE_PROVIDER_SITE_OTHER): Payer: BLUE CROSS/BLUE SHIELD | Admitting: Family Medicine

## 2014-07-16 VITALS — BP 118/72 | HR 85 | Temp 98.3°F | Resp 18 | Ht 70.0 in | Wt 180.0 lb

## 2014-07-16 DIAGNOSIS — S61209A Unspecified open wound of unspecified finger without damage to nail, initial encounter: Principal | ICD-10-CM

## 2014-07-16 DIAGNOSIS — L089 Local infection of the skin and subcutaneous tissue, unspecified: Secondary | ICD-10-CM

## 2014-07-16 DIAGNOSIS — S61202A Unspecified open wound of right middle finger without damage to nail, initial encounter: Secondary | ICD-10-CM | POA: Diagnosis not present

## 2014-07-16 MED ORDER — DOXYCYCLINE HYCLATE 100 MG PO TABS: 100.0000 mg | ORAL_TABLET | Freq: Two times a day (BID) | ORAL | Status: DC

## 2014-07-16 NOTE — Patient Instructions (Signed)
Warm compresses for finger, and if redness or discharge returns - start doxycycline.   Return to the clinic or go to the nearest emergency room if any of your symptoms worsen or new symptoms occur.  Cellulitis Cellulitis is an infection of the skin and the tissue beneath it. The infected area is usually red and tender. Cellulitis occurs most often in the arms and lower legs.  CAUSES  Cellulitis is caused by bacteria that enter the skin through cracks or cuts in the skin. The most common types of bacteria that cause cellulitis are staphylococci and streptococci. SIGNS AND SYMPTOMS   Redness and warmth.  Swelling.  Tenderness or pain.  Fever. DIAGNOSIS  Your health care provider can usually determine what is wrong based on a physical exam. Blood tests may also be done. TREATMENT  Treatment usually involves taking an antibiotic medicine. HOME CARE INSTRUCTIONS   Take your antibiotic medicine as directed by your health care provider. Finish the antibiotic even if you start to feel better.  Keep the infected arm or leg elevated to reduce swelling.  Apply a warm cloth to the affected area up to 4 times per day to relieve pain.  Take medicines only as directed by your health care provider.  Keep all follow-up visits as directed by your health care provider. SEEK MEDICAL CARE IF:   You notice red streaks coming from the infected area.  Your red area gets larger or turns dark in color.  Your bone or joint underneath the infected area becomes painful after the skin has healed.  Your infection returns in the same area or another area.  You notice a swollen bump in the infected area.  You develop new symptoms.  You have a fever. SEEK IMMEDIATE MEDICAL CARE IF:   You feel very sleepy.  You develop vomiting or diarrhea.  You have a general ill feeling (malaise) with muscle aches and pains. MAKE SURE YOU:   Understand these instructions.  Will watch your condition.  Will  get help right away if you are not doing well or get worse. Document Released: 12/13/2004 Document Revised: 07/20/2013 Document Reviewed: 05/21/2011 [facility] Patient Information 2015 [place]. This information is not intended to replace advice given to you by your health care provider. Make sure you discuss any questions you have with your health care provider.

## 2014-07-16 NOTE — Progress Notes (Signed)
Subjective:    Patient ID: Cody Gutierrez, male    DOB: 06-Dec-1980, 34 y.o.   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  [facility], no fx on XR. Had redness, swelling 2 days ago with discharge. Took out his own sutures 2 nights ago. Some redness and swelling yesterday as well, but somewhat better today. No fever. Able to express discharge last night, but not much this morning.   No ointments.   Paramedic.  Non-smoker.   There are no active problems to display for this patient.  Past Medical History  Diagnosis Date  . Back fracture   . Migraine   . Depression    Past Surgical History  Procedure Laterality Date  . Tonsillectomy     Allergies  Allergen Reactions  . Bee Venom Anaphylaxis  . Grape Seed Anaphylaxis  . Erythromycin Nausea Only and Other (See Comments)    GI distress   Prior to Admission medications   Medication Sig Start Date End Date Taking? Authorizing Provider  aspirin-acetaminophen-caffeine (EXCEDRIN MIGRAINE) [phone] MG per tablet Take 2 tablets by mouth every 6 (six) hours as needed for headache.   Yes Historical Provider, MD  loratadine (CLARITIN) 10 MG tablet Take 10 mg by mouth daily as needed for allergies.   Yes Historical Provider, MD  sertraline (ZOLOFT) 25 MG tablet Take 25 mg by mouth daily.   Yes Historical Provider, MD  tetrahydrozoline 0.05 % ophthalmic solution Place 2 drops into both eyes daily as needed (for dry eyes).   Yes Historical Provider, MD   History   Social History  . Marital Status: Single    Spouse Name: N/A  . Number of Children: N/A  . Years of Education: N/A   Occupational History  . Not on file.   Social History Main Topics  . Smoking status: Former Smoker -- 15 years  . Smokeless tobacco: Not on file  . Alcohol Use: No  . Drug Use: No  . Sexual Activity: Not on file   Other Topics Concern  . Not on file   Social History Narrative         Review of Systems  Constitutional: Negative for fever and chills.  Skin: Positive for wound.       Objective:   Physical Exam  Constitutional: He appears well-developed and well-nourished. No distress.  Pulmonary/Chest: Effort normal.  Musculoskeletal: Normal range of motion.       Right hand: Decreased sensation (distal to wound. ) noted.       Hands: Skin: Skin is warm and dry.  Psychiatric: He has a normal mood and affect. His behavior is normal.  Vitals reviewed.  Filed Vitals:   07/16/14 0808  BP: 118/72  Pulse: 85  Temp: 98.3 F (36.8 C)  TempSrc: Oral  Resp: 18  Height: 5\' 10"  (1.778 m)  Weight: 180 lb (81.647 kg)  SpO2: 98%       Assessment & Plan:   Cody Gutierrez Gutierrez is a 34 y.o. male Open wound of finger, infected, initial encounter - Plan: doxycycline (VIBRA-TABS) 100 MG tablet  Infected wound by hx/cellulitis, but interval improvement today.   -if worsens again - start doxycycline, warm compresses and RTC precautions. Keep clean/covered.   Meds ordered this encounter  Medications  . sertraline (ZOLOFT) 25 MG tablet    Sig: Take 25 mg by mouth daily.  .Cody Gutierrez  doxycycline (VIBRA-TABS) 100 MG tablet    Sig: Take 1 tablet (100 mg total) by mouth 2 (two) times daily.    Dispense:  20 tablet    Refill:  0   Patient Instructions  Warm compresses for finger, and if redness or discharge returns - start doxycycline.   Return to the clinic or go to the nearest emergency room if any of your symptoms worsen or new symptoms occur.  Cellulitis Cellulitis is an infection of the skin and the tissue beneath it. The infected area is usually red and tender. Cellulitis occurs most often in the arms and lower legs.  CAUSES  Cellulitis is caused by bacteria that enter the skin through cracks or cuts in the skin. The most common types of bacteria that cause cellulitis are staphylococci and streptococci. SIGNS AND SYMPTOMS   Redness and warmth.  Swelling.  Tenderness  or pain.  Fever. DIAGNOSIS  Your health care provider can usually determine what is wrong based on a physical exam. Blood tests may also be done. TREATMENT  Treatment usually involves taking an antibiotic medicine. HOME CARE INSTRUCTIONS   Take your antibiotic medicine as directed by your health care provider. Finish the antibiotic even if you start to feel better.  Keep the infected arm or leg elevated to reduce swelling.  Apply a warm cloth to the affected area up to 4 times per day to relieve pain.  Take medicines only as directed by your health care provider.  Keep all follow-up visits as directed by your health care provider. SEEK MEDICAL CARE IF:   You notice red streaks coming from the infected area.  Your red area gets larger or turns dark in color.  Your bone or joint underneath the infected area becomes painful after the skin has healed.  Your infection returns in the same area or another area.  You notice a swollen bump in the infected area.  You develop new symptoms.  You have a fever. SEEK IMMEDIATE MEDICAL CARE IF:   You feel very sleepy.  You develop vomiting or diarrhea.  You have a general ill feeling (malaise) with muscle aches and pains. MAKE SURE YOU:   Understand these instructions.  Will watch your condition.  Will get help right away if you are not doing well or get worse. Document Released: 12/13/2004 Document Revised: 07/20/2013 Document Reviewed: 05/21/2011 [facility] Patient Information 2015 [place], [place]. This information is not intended to replace advice given to you by your health care provider. Make sure you discuss any questions you have with your health care provider.

## 2016-04-26 ENCOUNTER — Encounter (HOSPITAL_BASED_OUTPATIENT_CLINIC_OR_DEPARTMENT_OTHER): Payer: Self-pay

## 2016-04-26 DIAGNOSIS — G4733 Obstructive sleep apnea (adult) (pediatric): Secondary | ICD-10-CM

## 2016-04-26 DIAGNOSIS — R5383 Other fatigue: Secondary | ICD-10-CM

## 2016-04-26 DIAGNOSIS — R0683 Snoring: Secondary | ICD-10-CM

## 2016-05-21 ENCOUNTER — Encounter (HOSPITAL_BASED_OUTPATIENT_CLINIC_OR_DEPARTMENT_OTHER): Payer: BLUE CROSS/BLUE SHIELD

## 2016-06-21 ENCOUNTER — Ambulatory Visit (HOSPITAL_BASED_OUTPATIENT_CLINIC_OR_DEPARTMENT_OTHER): Payer: BLUE CROSS/BLUE SHIELD | Attending: Family Medicine

## 2017-04-19 DIAGNOSIS — K648 Other hemorrhoids: Secondary | ICD-10-CM | POA: Diagnosis not present

## 2017-05-01 DIAGNOSIS — M79601 Pain in right arm: Secondary | ICD-10-CM | POA: Diagnosis not present

## 2017-05-06 DIAGNOSIS — M79601 Pain in right arm: Secondary | ICD-10-CM | POA: Diagnosis not present

## 2017-05-10 DIAGNOSIS — M79601 Pain in right arm: Secondary | ICD-10-CM | POA: Diagnosis not present

## 2017-05-13 DIAGNOSIS — M79601 Pain in right arm: Secondary | ICD-10-CM | POA: Diagnosis not present

## 2017-05-16 DIAGNOSIS — M79601 Pain in right arm: Secondary | ICD-10-CM | POA: Diagnosis not present

## 2017-05-16 DIAGNOSIS — K642 Third degree hemorrhoids: Secondary | ICD-10-CM | POA: Diagnosis not present

## 2017-05-22 DIAGNOSIS — M79601 Pain in right arm: Secondary | ICD-10-CM | POA: Diagnosis not present

## 2017-06-06 DIAGNOSIS — Z0001 Encounter for general adult medical examination with abnormal findings: Secondary | ICD-10-CM | POA: Diagnosis not present

## 2017-06-06 DIAGNOSIS — Z91038 Other insect allergy status: Secondary | ICD-10-CM | POA: Diagnosis not present

## 2017-07-11 DIAGNOSIS — Z79899 Other long term (current) drug therapy: Secondary | ICD-10-CM | POA: Diagnosis not present

## 2017-07-31 DIAGNOSIS — G4733 Obstructive sleep apnea (adult) (pediatric): Secondary | ICD-10-CM | POA: Diagnosis not present

## 2017-08-09 DIAGNOSIS — G4733 Obstructive sleep apnea (adult) (pediatric): Secondary | ICD-10-CM | POA: Diagnosis not present

## 2017-08-26 DIAGNOSIS — G4733 Obstructive sleep apnea (adult) (pediatric): Secondary | ICD-10-CM | POA: Diagnosis not present

## 2018-01-22 ENCOUNTER — Other Ambulatory Visit: Payer: Self-pay | Admitting: Physician Assistant

## 2018-01-22 DIAGNOSIS — R1013 Epigastric pain: Secondary | ICD-10-CM | POA: Diagnosis not present

## 2018-01-22 DIAGNOSIS — K219 Gastro-esophageal reflux disease without esophagitis: Secondary | ICD-10-CM | POA: Diagnosis not present

## 2018-01-27 ENCOUNTER — Ambulatory Visit
Admission: RE | Admit: 2018-01-27 | Discharge: 2018-01-27 | Disposition: A | Discharge status: Home / Self Care | Payer: 59 | Source: Ambulatory Visit | Attending: Physician Assistant | Admitting: Physician Assistant

## 2018-01-27 DIAGNOSIS — R1013 Epigastric pain: Secondary | ICD-10-CM

## 2018-01-27 DIAGNOSIS — K7689 Other specified diseases of liver: Secondary | ICD-10-CM | POA: Diagnosis not present

## 2018-05-12 DIAGNOSIS — G4733 Obstructive sleep apnea (adult) (pediatric): Secondary | ICD-10-CM | POA: Diagnosis not present

## 2018-05-30 DIAGNOSIS — M9901 Segmental and somatic dysfunction of cervical region: Secondary | ICD-10-CM | POA: Diagnosis not present

## 2018-05-30 DIAGNOSIS — R51 Headache: Secondary | ICD-10-CM | POA: Diagnosis not present

## 2018-05-30 DIAGNOSIS — M531 Cervicobrachial syndrome: Secondary | ICD-10-CM | POA: Diagnosis not present

## 2018-06-02 DIAGNOSIS — R51 Headache: Secondary | ICD-10-CM | POA: Diagnosis not present

## 2018-06-02 DIAGNOSIS — M531 Cervicobrachial syndrome: Secondary | ICD-10-CM | POA: Diagnosis not present

## 2018-06-02 DIAGNOSIS — M9901 Segmental and somatic dysfunction of cervical region: Secondary | ICD-10-CM | POA: Diagnosis not present

## 2018-06-05 DIAGNOSIS — M9901 Segmental and somatic dysfunction of cervical region: Secondary | ICD-10-CM | POA: Diagnosis not present

## 2018-06-05 DIAGNOSIS — M531 Cervicobrachial syndrome: Secondary | ICD-10-CM | POA: Diagnosis not present

## 2018-06-05 DIAGNOSIS — R51 Headache: Secondary | ICD-10-CM | POA: Diagnosis not present

## 2018-06-06 DIAGNOSIS — M531 Cervicobrachial syndrome: Secondary | ICD-10-CM | POA: Diagnosis not present

## 2018-06-06 DIAGNOSIS — R51 Headache: Secondary | ICD-10-CM | POA: Diagnosis not present

## 2018-06-06 DIAGNOSIS — M9901 Segmental and somatic dysfunction of cervical region: Secondary | ICD-10-CM | POA: Diagnosis not present

## 2018-06-09 DIAGNOSIS — R51 Headache: Secondary | ICD-10-CM | POA: Diagnosis not present

## 2018-06-09 DIAGNOSIS — M9901 Segmental and somatic dysfunction of cervical region: Secondary | ICD-10-CM | POA: Diagnosis not present

## 2018-06-09 DIAGNOSIS — M531 Cervicobrachial syndrome: Secondary | ICD-10-CM | POA: Diagnosis not present

## 2018-06-11 DIAGNOSIS — M531 Cervicobrachial syndrome: Secondary | ICD-10-CM | POA: Diagnosis not present

## 2018-06-11 DIAGNOSIS — M9901 Segmental and somatic dysfunction of cervical region: Secondary | ICD-10-CM | POA: Diagnosis not present

## 2018-06-11 DIAGNOSIS — R51 Headache: Secondary | ICD-10-CM | POA: Diagnosis not present

## 2018-06-13 DIAGNOSIS — R51 Headache: Secondary | ICD-10-CM | POA: Diagnosis not present

## 2018-06-13 DIAGNOSIS — M9901 Segmental and somatic dysfunction of cervical region: Secondary | ICD-10-CM | POA: Diagnosis not present

## 2018-06-13 DIAGNOSIS — M531 Cervicobrachial syndrome: Secondary | ICD-10-CM | POA: Diagnosis not present

## 2018-06-19 DIAGNOSIS — M9901 Segmental and somatic dysfunction of cervical region: Secondary | ICD-10-CM | POA: Diagnosis not present

## 2018-06-19 DIAGNOSIS — M531 Cervicobrachial syndrome: Secondary | ICD-10-CM | POA: Diagnosis not present

## 2018-06-19 DIAGNOSIS — R51 Headache: Secondary | ICD-10-CM | POA: Diagnosis not present

## 2018-06-23 DIAGNOSIS — M9901 Segmental and somatic dysfunction of cervical region: Secondary | ICD-10-CM | POA: Diagnosis not present

## 2018-06-23 DIAGNOSIS — M531 Cervicobrachial syndrome: Secondary | ICD-10-CM | POA: Diagnosis not present

## 2018-06-23 DIAGNOSIS — R51 Headache: Secondary | ICD-10-CM | POA: Diagnosis not present

## 2019-01-15 IMAGING — US US ABDOMEN COMPLETE
1 series · 13 of 25 positions shown · non-contrast
Comparison: Abdominal and pelvic CT scan May 27, 2011

CLINICAL DATA: Abdominal pain, indigestion, nausea and vomiting,
and left upper quadrant pain episodically for the past month.

EXAM:
ABDOMEN ULTRASOUND COMPLETE

[Series 1: us abdomen complete · 0.20mm/px · 13 of 81 slices shown]
[im 1/81]
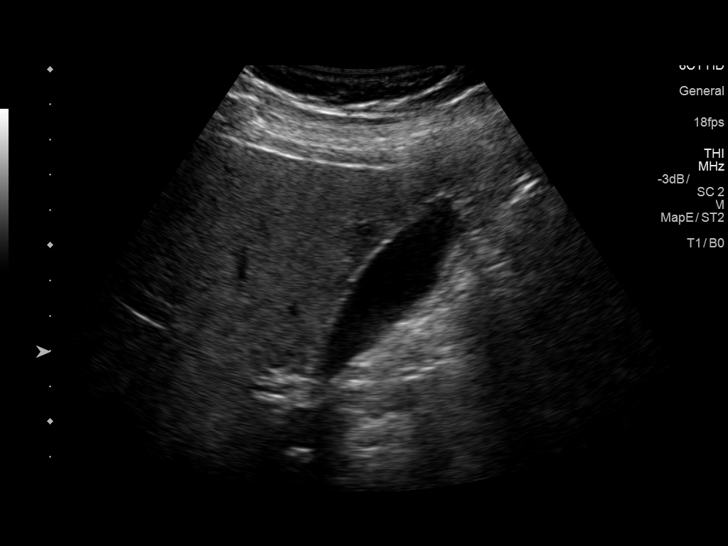
[im 7/81]
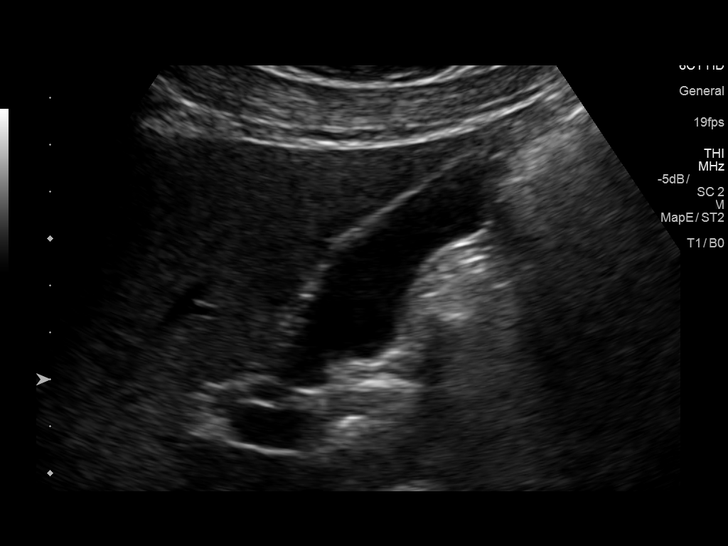
[im 14/81]
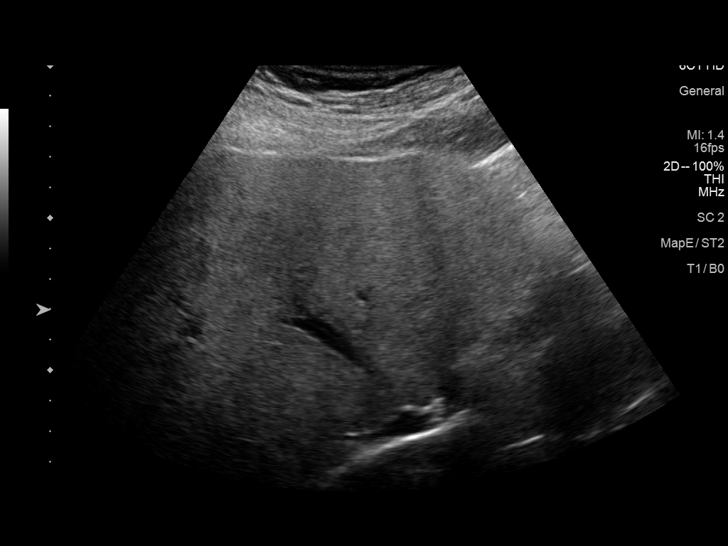
[im 21/81]
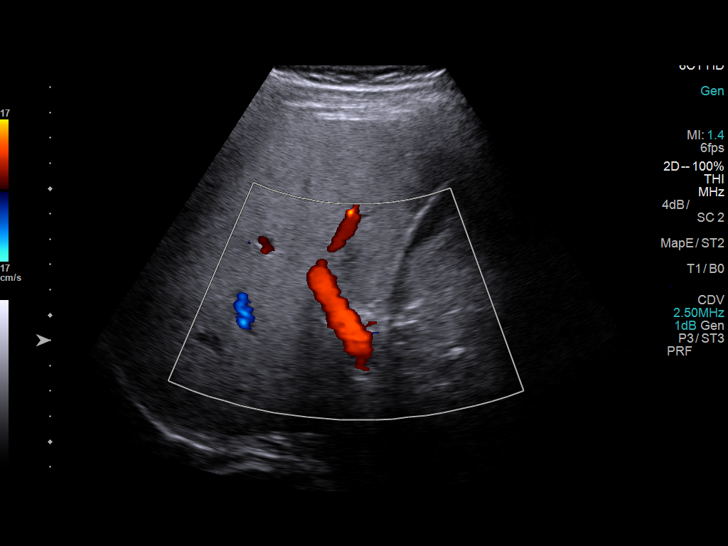
[im 27/81]
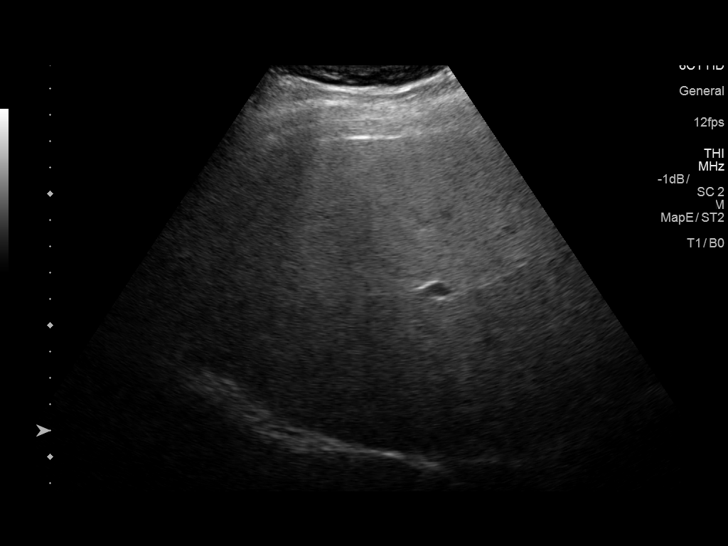
[im 34/81]
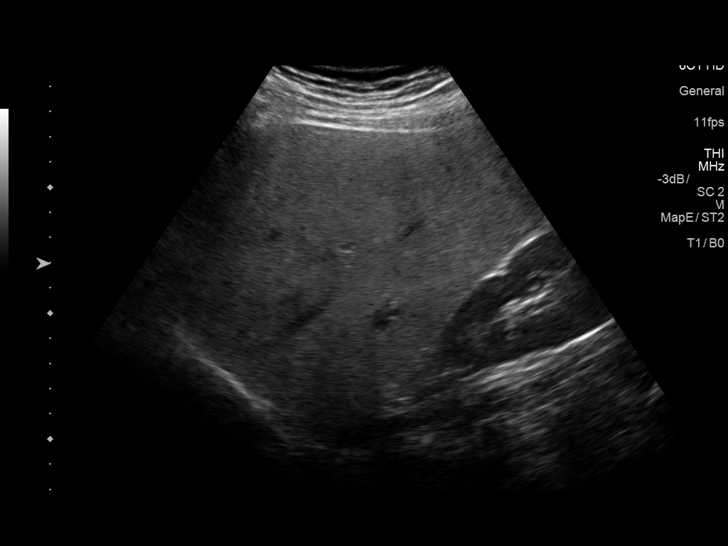
[im 41/81]
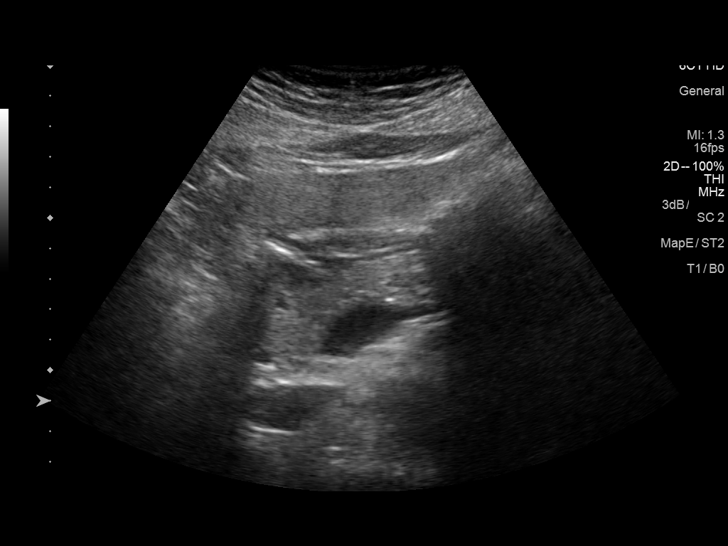
[im 47/81]
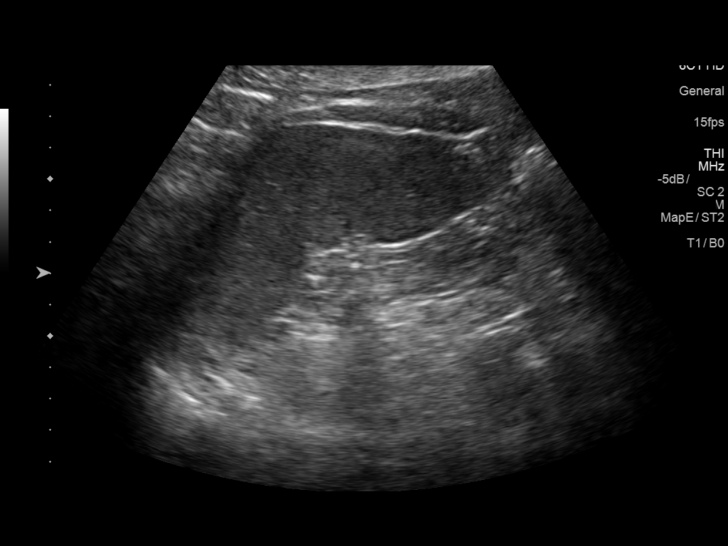
[im 54/81]
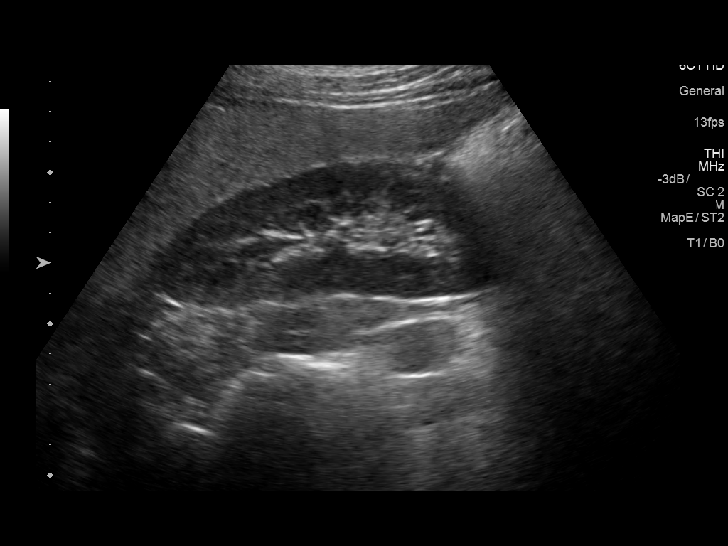
[im 61/81]
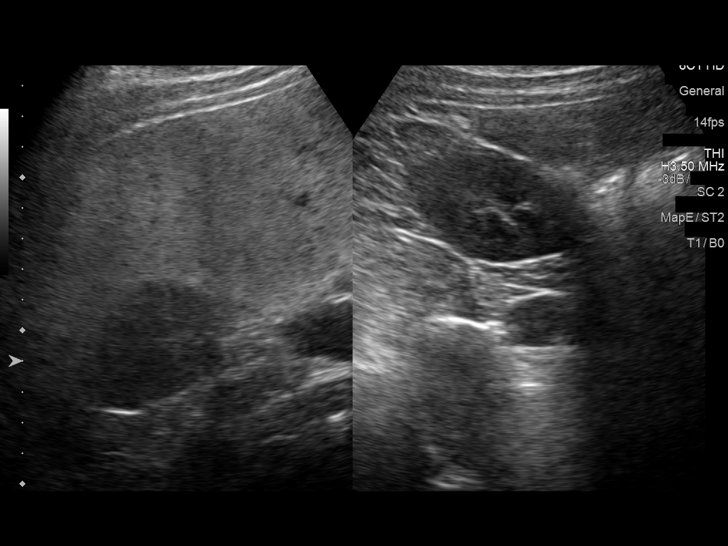
[im 67/81]
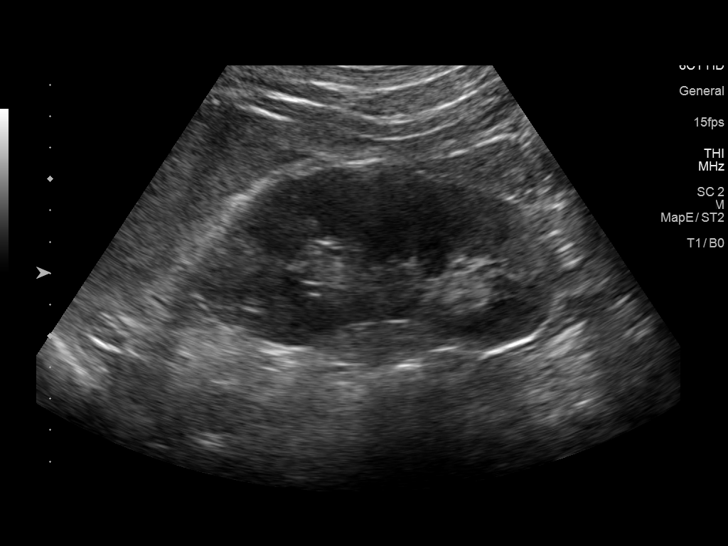
[im 74/81]
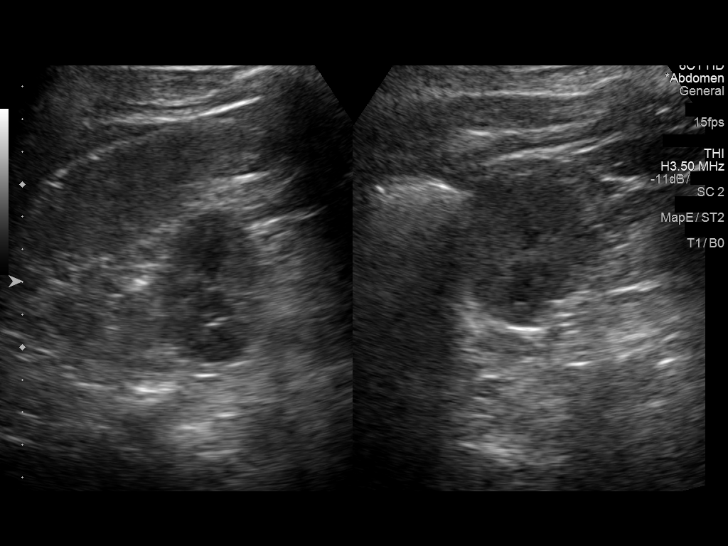
[im 81/81]
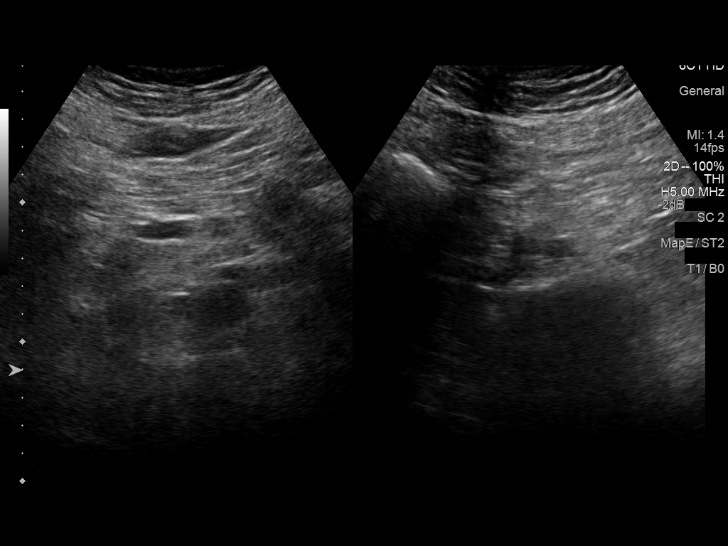

[13 of 25 positions shown; findings below may reference images not displayed]

FINDINGS: Gallbladder: No gallstones or wall thickening visualized. No
sonographic Murphy sign noted by sonographer.

Common bile duct: Diameter: 3.9 mm

Liver: The hepatic echotexture is mildly increased. The surface
contour of the liver is smooth. There is no focal mass nor ductal
dilation. Portal vein is patent on color Doppler imaging with normal
direction of blood flow towards the liver.

IVC: No abnormality visualized.

Pancreas: The pancreatic head and body appear normal. The pancreatic
tail is obscured by bowel gas.

Spleen: Size and appearance within normal limits.

Right Kidney: Length: 12.0 cm. Echogenicity within normal limits. No
mass or hydronephrosis visualized.

Left Kidney: Length: 11.5 cm. Echogenicity within normal limits. No
mass or hydronephrosis visualized.

Abdominal aorta: No aneurysm visualized.

Other findings: No ascites.
IMPRESSION: No gallstones or sonographic evidence of acute cholecystitis. If
there are clinical concerns of chronic gallbladder dysfunction, a
nuclear medicine hepatobiliary scan with gallbladder ejection
fraction determination may be useful.

Mildly increased hepatic echotexture most compatible with fatty
infiltrative change. Limited visualization of the pancreatic tail.

## 2020-10-14 NOTE — Progress Notes (Signed)
Pt. Needs orders for upcoming surgery.PAT and labs on 10/18/20. 

## 2020-10-14 NOTE — Patient Instructions (Addendum)
DUE TO COVID-19 ONLY ONE VISITOR IS ALLOWED TO COME WITH YOU AND STAY IN THE WAITING ROOM ONLY DURING PRE OP AND PROCEDURE DAY OF SURGERY. THE 1 VISITOR  MAY VISIT WITH YOU AFTER SURGERY IN YOUR PRIVATE ROOM DURING VISITING HOURS ONLY!               Cody Gutierrez   Your procedure is scheduled on: 11/10/20   Report to [facility] Main  Entrance   Report to admitting at : 7:30 AM     Call this number if you have problems the morning of surgery [phone]    Remember: Do not eat solid food :After Midnight. Clear liquid until: 6:30 AM.  CLEAR LIQUID DIET  Foods Allowed                                                                     Foods Excluded  Coffee and tea, regular and decaf                             liquids that you cannot  Plain Jell-O any favor except red or purple                                           see through such as: Fruit ices (not with fruit pulp)                                     milk, soups, orange juice  Iced Popsicles                                    All solid food Carbonated beverages, regular and diet                                    Cranberry, grape and apple juices Sports drinks like Gatorade Lightly seasoned clear broth or consume(fat free) Sugar, honey syrup  Sample Menu Breakfast                                Lunch                                     Supper Cranberry juice                    Beef broth                            Chicken broth Jell-O                                     Grape  juice                           Apple juice Coffee or tea                        Jell-O                                      Popsicle                                                Coffee or tea                        Coffee or tea  _____________________________________________________________________   BRUSH YOUR TEETH MORNING OF SURGERY AND RINSE YOUR MOUTH OUT, NO CHEWING GUM CANDY OR MINTS.    Take these medicines the morning of surgery  with A SIP OF WATER: Buspar,cetirizine,ritalin.                               You may not have any metal on your body including hair pins and              piercings  Do not wear jewelry,lotions, powders or perfumes, deodorant             Men may shave face and neck.   Do not bring valuables to the hospital. [facility] IS NOT             RESPONSIBLE   FOR VALUABLES.  Contacts, dentures or bridgework may not be worn into surgery.  Leave suitcase in the car. After surgery it may be brought to your room.     Patients discharged the day of surgery will not be allowed to drive home. IF YOU ARE HAVING SURGERY AND GOING HOME THE SAME DAY, YOU MUST HAVE AN ADULT TO DRIVE YOU HOME AND BE WITH YOU FOR 24 HOURS. YOU MAY GO HOME BY TAXI OR UBER OR ORTHERWISE, BUT AN ADULT MUST ACCOMPANY YOU HOME AND STAY WITH YOU FOR 24 HOURS.  Name and phone number of your driver:  Special Instructions: N/A              Please read over the following fact sheets you were given: _____________________________________________________________________  PLEASE BRING CPAP MASK AND  TUBING ONLY. DEVICE WILL BE PROVIDED!            [place] - Preparing for Surgery Before surgery, you can play an important role.  Because skin is not sterile, your skin needs to be as free of germs as possible.  You can reduce the number of germs on your skin by washing with CHG (chlorahexidine gluconate) soap before surgery.  CHG is an antiseptic cleaner which kills germs and bonds with the skin to continue killing germs even after washing. Please DO NOT use if you have an allergy to CHG or antibacterial soaps.  If your skin becomes reddened/irritated stop using the CHG and inform your nurse when you arrive at Short Stay. Do not shave (including legs and underarms) for at least 48 hours prior to the first CHG shower.  You may shave your face/neck. Please follow these instructions carefully:  1.  Shower with CHG Soap the night before surgery  and the  morning of Surgery.  2.  If you choose to wash your hair, wash your hair first as usual with your  normal  shampoo.  3.  After you shampoo, rinse your hair and body thoroughly to remove the  shampoo.                           4.  Use CHG as you would any other liquid soap.  You can apply chg directly  to the skin and wash                       Gently with a scrungie or clean washcloth.  5.  Apply the CHG Soap to your body ONLY FROM THE NECK DOWN.   Do not use on face/ open                           Wound or open sores. Avoid contact with eyes, ears mouth and genitals (private parts).                       Wash face,  Genitals (private parts) with your normal soap.             6.  Wash thoroughly, paying special attention to the area where your surgery  will be performed.  7.  Thoroughly rinse your body with warm water from the neck down.  8.  DO NOT shower/wash with your normal soap after using and rinsing off  the CHG Soap.                9.  Pat yourself dry with a clean towel.            10.  Wear clean pajamas.            11.  Place clean sheets on your bed the night of your first shower and do not  sleep with pets. Day of Surgery : Do not apply any lotions/deodorants the morning of surgery.  Please wear clean clothes to the hospital/surgery center.  FAILURE TO FOLLOW THESE INSTRUCTIONS MAY RESULT IN THE CANCELLATION OF YOUR SURGERY PATIENT SIGNATURE_________________________________  NURSE SIGNATURE__________________________________  ________________________________________________________________________

## 2020-10-18 ENCOUNTER — Other Ambulatory Visit: Payer: Self-pay

## 2020-10-18 ENCOUNTER — Encounter (HOSPITAL_COMMUNITY): Payer: Self-pay

## 2020-10-18 ENCOUNTER — Encounter (HOSPITAL_COMMUNITY)
Admission: RE | Admit: 2020-10-18 | Discharge: 2020-10-18 | Disposition: A | Discharge status: Home / Self Care | Payer: 59 | Source: Ambulatory Visit | Attending: General Surgery | Admitting: General Surgery

## 2020-10-18 DIAGNOSIS — Z01812 Encounter for preprocedural laboratory examination: Secondary | ICD-10-CM | POA: Diagnosis present

## 2020-10-18 HISTORY — DX: Anxiety disorder, unspecified: F41.9

## 2020-10-18 HISTORY — DX: Pneumonia, unspecified organism: J18.9

## 2020-10-18 LAB — CBC
HCT: 44.5 % (ref 39.0–52.0)
Hemoglobin: 14.8 g/dL (ref 13.0–17.0)
MCH: 30.2 pg (ref 26.0–34.0)
MCHC: 33.3 g/dL (ref 30.0–36.0)
MCV: 90.8 fL (ref 80.0–100.0)
Platelets: 336 10*3/uL (ref 150–400)
RBC: 4.9 MIL/uL (ref 4.22–5.81)
RDW: 12.5 % (ref 11.5–15.5)
WBC: 7.6 10*3/uL (ref 4.0–10.5)
nRBC: 0 % (ref 0.0–0.2)

## 2020-10-18 NOTE — Progress Notes (Signed)
COVID Vaccine Completed: Yes Date COVID Vaccine completed: 2021. [place] COVID vaccine manufacturer: Pfizer     PCP - Dr. Blair Heys Cardiologist -   Chest x-ray -  EKG -  Stress Test -  ECHO -  Cardiac Cath -  Pacemaker/ICD device last checked:  Sleep Study - Yes CPAP - Yes  Fasting Blood Sugar -  Checks Blood Sugar _____ times a day  Blood Thinner Instructions: Aspirin Instructions: Last Dose:  Anesthesia review:   Patient denies shortness of breath, fever, cough and chest pain at PAT appointment   Patient verbalized understanding of instructions that were given to them at the PAT appointment. Patient was also instructed that they will need to review over the PAT instructions again at home before surgery.

## 2020-11-08 ENCOUNTER — Ambulatory Visit: Payer: Self-pay | Admitting: General Surgery

## 2020-11-09 MED ORDER — BUPIVACAINE LIPOSOME 1.3 % IJ SUSP
20.0000 mL | Freq: Once | INTRAMUSCULAR | Status: DC
  Filled 2020-11-09: qty 20

## 2020-11-09 NOTE — Anesthesia Preprocedure Evaluation (Addendum)
Anesthesia Evaluation  Patient identified by MRN, date of birth, ID band  Reviewed: Allergy & Precautions, NPO status , Patient's Chart, lab work & pertinent test results  Airway Mallampati: II  TM Distance: <3 FB Neck ROM: Full    Dental no notable dental hx. (+) Teeth Intact, Dental Advisory Given   Pulmonary Patient abstained from smoking., former smoker,    Pulmonary exam normal breath sounds clear to auscultation       Cardiovascular Exercise Tolerance: Good negative cardio ROS Normal cardiovascular exam Rhythm:Regular Rate:Normal     Neuro/Psych  Headaches, Anxiety    GI/Hepatic negative GI ROS, Neg liver ROS,   Endo/Other  negative endocrine ROS  Renal/GU negative Renal ROS     Musculoskeletal negative musculoskeletal ROS (+)   Abdominal   Peds  Hematology   Anesthesia Other Findings All : codeine  Reproductive/Obstetrics                            Anesthesia Physical Anesthesia Plan  ASA: 2  Anesthesia Plan: General   Post-op Pain Management:    Induction: Intravenous  PONV Risk Score and Plan: 3 and Treatment may vary due to age or medical condition, Ondansetron, Dexamethasone and Midazolam  Airway Management Planned: Video Laryngoscope Planned  Additional Equipment: None  Intra-op Plan:   Post-operative Plan: Extubation in OR  Informed Consent:     Dental advisory given  Plan Discussed with: CRNA and Anesthesiologist  Anesthesia Plan Comments:        Anesthesia Quick Evaluation

## 2020-11-10 ENCOUNTER — Ambulatory Visit (HOSPITAL_COMMUNITY): Payer: 59 | Admitting: Anesthesiology

## 2020-11-10 ENCOUNTER — Encounter (HOSPITAL_COMMUNITY)
Admission: RE | Disposition: A | Discharge status: Home / Self Care | Payer: Self-pay | Source: Ambulatory Visit | Attending: General Surgery

## 2020-11-10 ENCOUNTER — Encounter (HOSPITAL_COMMUNITY): Payer: Self-pay | Admitting: General Surgery

## 2020-11-10 ENCOUNTER — Ambulatory Visit (HOSPITAL_COMMUNITY)
Admission: RE | Admit: 2020-11-10 | Discharge: 2020-11-10 | Disposition: A | Discharge status: Home / Self Care | Payer: 59 | Source: Ambulatory Visit | Attending: General Surgery | Admitting: General Surgery

## 2020-11-10 DIAGNOSIS — Z881 Allergy status to other antibiotic agents status: Secondary | ICD-10-CM | POA: Diagnosis not present

## 2020-11-10 DIAGNOSIS — Z79899 Other long term (current) drug therapy: Secondary | ICD-10-CM | POA: Diagnosis not present

## 2020-11-10 DIAGNOSIS — K644 Residual hemorrhoidal skin tags: Secondary | ICD-10-CM | POA: Insufficient documentation

## 2020-11-10 DIAGNOSIS — Z87891 Personal history of nicotine dependence: Secondary | ICD-10-CM | POA: Insufficient documentation

## 2020-11-10 HISTORY — PX: HEMORRHOID SURGERY: SHX153

## 2020-11-10 LAB — POCT I-STAT EG7
Acid-base deficit: 4 mmol/L — ABNORMAL HIGH (ref 0.0–2.0)
Bicarbonate: 24.7 mmol/L (ref 20.0–28.0)
Calcium, Ion: 1.27 mmol/L (ref 1.15–1.40)
HCT: 38 % — ABNORMAL LOW (ref 39.0–52.0)
Hemoglobin: 12.9 g/dL — ABNORMAL LOW (ref 13.0–17.0)
O2 Saturation: 95 %
Potassium: 4.9 mmol/L (ref 3.5–5.1)
Sodium: 137 mmol/L (ref 135–145)
TCO2: 26 mmol/L (ref 22–32)
pCO2, Ven: 60.2 mmHg — ABNORMAL HIGH (ref 44.0–60.0)
pH, Ven: 7.221 — ABNORMAL LOW (ref 7.250–7.430)
pO2, Ven: 95 mmHg — ABNORMAL HIGH (ref 32.0–45.0)

## 2020-11-10 SURGERY — HEMORRHOIDECTOMY
Anesthesia: General | Site: Anus

## 2020-11-10 MED ORDER — DEXMEDETOMIDINE (PRECEDEX) IN NS 20 MCG/5ML (4 MCG/ML) IV SYRINGE
PREFILLED_SYRINGE | INTRAVENOUS | Status: DC | PRN
  Administered 2020-11-10: 8 ug via INTRAVENOUS
  Administered 2020-11-10: 4 ug via INTRAVENOUS
  Administered 2020-11-10: 8 ug via INTRAVENOUS

## 2020-11-10 MED ORDER — ONDANSETRON HCL 4 MG/2ML IJ SOLN
4.0000 mg | Freq: Once | INTRAMUSCULAR | Status: AC | PRN
  Administered 2020-11-10: 4 mg via INTRAVENOUS

## 2020-11-10 MED ORDER — MIDAZOLAM HCL 5 MG/5ML IJ SOLN
INTRAMUSCULAR | Status: DC | PRN
  Administered 2020-11-10: 2 mg via INTRAVENOUS

## 2020-11-10 MED ORDER — BUPIVACAINE-EPINEPHRINE 0.5% -1:200000 IJ SOLN
INTRAMUSCULAR | Status: DC | PRN
  Administered 2020-11-10: 30 mL

## 2020-11-10 MED ORDER — CEFAZOLIN SODIUM-DEXTROSE 2-4 GM/100ML-% IV SOLN
2.0000 g | INTRAVENOUS | Status: AC
  Administered 2020-11-10: 2 g via INTRAVENOUS
  Filled 2020-11-10: qty 100

## 2020-11-10 MED ORDER — OXYCODONE HCL 5 MG PO TABS
5.0000 mg | ORAL_TABLET | Freq: Once | ORAL | Status: AC | PRN
  Administered 2020-11-10: 5 mg via ORAL

## 2020-11-10 MED ORDER — LIDOCAINE HCL (CARDIAC) PF 100 MG/5ML IV SOSY
PREFILLED_SYRINGE | INTRAVENOUS | Status: DC | PRN
  Administered 2020-11-10: 100 mg via INTRAVENOUS

## 2020-11-10 MED ORDER — FENTANYL CITRATE (PF) 100 MCG/2ML IJ SOLN
INTRAMUSCULAR | Status: DC | PRN
  Administered 2020-11-10 (×2): 100 ug via INTRAVENOUS

## 2020-11-10 MED ORDER — BUPIVACAINE LIPOSOME 1.3 % IJ SUSP
INTRAMUSCULAR | Status: DC | PRN
  Administered 2020-11-10: 20 mL

## 2020-11-10 MED ORDER — HYDROMORPHONE HCL 1 MG/ML IJ SOLN
INTRAMUSCULAR | Status: AC
  Filled 2020-11-10: qty 1

## 2020-11-10 MED ORDER — CELECOXIB 200 MG PO CAPS
400.0000 mg | ORAL_CAPSULE | ORAL | Status: AC
  Administered 2020-11-10: 400 mg via ORAL
  Filled 2020-11-10: qty 2

## 2020-11-10 MED ORDER — 0.9 % SODIUM CHLORIDE (POUR BTL) OPTIME
TOPICAL | Status: DC | PRN
  Administered 2020-11-10: 1000 mL

## 2020-11-10 MED ORDER — ACETAMINOPHEN 10 MG/ML IV SOLN: 1000.0000 mg | Freq: Once | INTRAVENOUS | Status: DC | PRN

## 2020-11-10 MED ORDER — FENTANYL CITRATE (PF) 100 MCG/2ML IJ SOLN
INTRAMUSCULAR | Status: AC
  Filled 2020-11-10: qty 2

## 2020-11-10 MED ORDER — HYDROMORPHONE HCL 1 MG/ML IJ SOLN
0.2500 mg | INTRAMUSCULAR | Status: DC | PRN
  Administered 2020-11-10: 0.25 mg via INTRAVENOUS
  Administered 2020-11-10: 0.5 mg via INTRAVENOUS
  Administered 2020-11-10: 0.25 mg via INTRAVENOUS

## 2020-11-10 MED ORDER — BUPIVACAINE-EPINEPHRINE (PF) 0.5% -1:200000 IJ SOLN
INTRAMUSCULAR | Status: AC
  Filled 2020-11-10: qty 30

## 2020-11-10 MED ORDER — ONDANSETRON HCL 4 MG/2ML IJ SOLN
INTRAMUSCULAR | Status: DC | PRN
  Administered 2020-11-10: 4 mg via INTRAVENOUS

## 2020-11-10 MED ORDER — OXYCODONE HCL 5 MG/5ML PO SOLN: 5.0000 mg | Freq: Once | ORAL | Status: AC | PRN

## 2020-11-10 MED ORDER — AMISULPRIDE (ANTIEMETIC) 5 MG/2ML IV SOLN
10.0000 mg | Freq: Once | INTRAVENOUS | Status: AC | PRN
  Administered 2020-11-10: 10 mg via INTRAVENOUS

## 2020-11-10 MED ORDER — ACETAMINOPHEN 500 MG PO TABS
1000.0000 mg | ORAL_TABLET | ORAL | Status: AC
  Administered 2020-11-10: 1000 mg via ORAL
  Filled 2020-11-10: qty 2

## 2020-11-10 MED ORDER — MIDAZOLAM HCL 2 MG/2ML IJ SOLN
INTRAMUSCULAR | Status: AC
  Filled 2020-11-10: qty 2

## 2020-11-10 MED ORDER — ORAL CARE MOUTH RINSE: 15.0000 mL | Freq: Once | OROMUCOSAL | Status: AC

## 2020-11-10 MED ORDER — AMISULPRIDE (ANTIEMETIC) 5 MG/2ML IV SOLN
INTRAVENOUS | Status: AC
  Filled 2020-11-10: qty 4

## 2020-11-10 MED ORDER — ROCURONIUM BROMIDE 100 MG/10ML IV SOLN
INTRAVENOUS | Status: DC | PRN
  Administered 2020-11-10: 30 mg via INTRAVENOUS
  Administered 2020-11-10: 50 mg via INTRAVENOUS

## 2020-11-10 MED ORDER — CHLORHEXIDINE GLUCONATE CLOTH 2 % EX PADS: 6.0000 | MEDICATED_PAD | Freq: Once | CUTANEOUS | Status: DC

## 2020-11-10 MED ORDER — ROCURONIUM BROMIDE 10 MG/ML (PF) SYRINGE
PREFILLED_SYRINGE | INTRAVENOUS | Status: AC
  Filled 2020-11-10: qty 10

## 2020-11-10 MED ORDER — OXYCODONE HCL 5 MG PO TABS: 5.0000 mg | ORAL_TABLET | Freq: Four times a day (QID) | ORAL | 0 refills | Status: AC | PRN

## 2020-11-10 MED ORDER — DEXAMETHASONE SODIUM PHOSPHATE 10 MG/ML IJ SOLN
INTRAMUSCULAR | Status: DC | PRN
  Administered 2020-11-10: 10 mg via INTRAVENOUS

## 2020-11-10 MED ORDER — ONDANSETRON HCL 4 MG/2ML IJ SOLN
INTRAMUSCULAR | Status: AC
  Filled 2020-11-10: qty 2

## 2020-11-10 MED ORDER — PROPOFOL 10 MG/ML IV BOLUS
INTRAVENOUS | Status: DC | PRN
  Administered 2020-11-10: 20 mg via INTRAVENOUS
  Administered 2020-11-10: 150 mg via INTRAVENOUS

## 2020-11-10 MED ORDER — ENSURE PRE-SURGERY PO LIQD
296.0000 mL | Freq: Once | ORAL | Status: DC
  Filled 2020-11-10: qty 296

## 2020-11-10 MED ORDER — OXYCODONE HCL 5 MG PO TABS
ORAL_TABLET | ORAL | Status: AC
  Filled 2020-11-10: qty 1

## 2020-11-10 MED ORDER — CHLORHEXIDINE GLUCONATE 0.12 % MT SOLN
15.0000 mL | Freq: Once | OROMUCOSAL | Status: AC
  Administered 2020-11-10: 15 mL via OROMUCOSAL

## 2020-11-10 MED ORDER — LACTATED RINGERS IV SOLN: INTRAVENOUS | Status: DC

## 2020-11-10 MED ORDER — PROPOFOL 10 MG/ML IV BOLUS
INTRAVENOUS | Status: AC
  Filled 2020-11-10: qty 20

## 2020-11-10 MED ORDER — DEXMEDETOMIDINE (PRECEDEX) IN NS 20 MCG/5ML (4 MCG/ML) IV SYRINGE
PREFILLED_SYRINGE | INTRAVENOUS | Status: AC
  Filled 2020-11-10: qty 5

## 2020-11-10 MED ORDER — LIDOCAINE 2% (20 MG/ML) 5 ML SYRINGE
INTRAMUSCULAR | Status: AC
  Filled 2020-11-10: qty 5

## 2020-11-10 MED ORDER — SUGAMMADEX SODIUM 200 MG/2ML IV SOLN
INTRAVENOUS | Status: DC | PRN
  Administered 2020-11-10: 200 mg via INTRAVENOUS

## 2020-11-10 MED ORDER — IBUPROFEN 800 MG PO TABS: 800.0000 mg | ORAL_TABLET | Freq: Three times a day (TID) | ORAL | 0 refills | Status: AC | PRN

## 2020-11-10 SURGICAL SUPPLY — 42 items
BAG COUNTER SPONGE SURGICOUNT (BAG) IMPLANT
BLADE EXTENDED COATED 6.5IN (ELECTRODE) IMPLANT
BLADE HEX COATED 2.75 (ELECTRODE) IMPLANT
BLADE SURG 15 STRL LF DISP TIS (BLADE) ×1 IMPLANT
BLADE SURG 15 STRL SS (BLADE) ×1
CNTNR URN SCR LID CUP LEK RST (MISCELLANEOUS) ×1 IMPLANT
CONT SPEC 4OZ STRL OR WHT (MISCELLANEOUS) ×1
COVER SURGICAL LIGHT HANDLE (MISCELLANEOUS) ×2 IMPLANT
DECANTER SPIKE VIAL GLASS SM (MISCELLANEOUS) IMPLANT
DRAPE LAPAROTOMY T 98X78 PEDS (DRAPES) ×2 IMPLANT
DRSG PAD ABDOMINAL 8X10 ST (GAUZE/BANDAGES/DRESSINGS) IMPLANT
ELECT REM PT RETURN 15FT ADLT (MISCELLANEOUS) ×2 IMPLANT
GAUZE 4X4 16PLY ~~LOC~~+RFID DBL (SPONGE) ×2 IMPLANT
GAUZE SPONGE 4X4 12PLY STRL (GAUZE/BANDAGES/DRESSINGS) ×2 IMPLANT
GLOVE SURG ENC MOIS LTX SZ7 (GLOVE) ×2 IMPLANT
GLOVE SURG UNDER POLY LF SZ7 (GLOVE) ×2 IMPLANT
GOWN STRL REUS W/TWL XL LVL3 (GOWN DISPOSABLE) ×2 IMPLANT
KIT BASIN OR (CUSTOM PROCEDURE TRAY) ×2 IMPLANT
KIT TURNOVER KIT A (KITS) ×2 IMPLANT
NEEDLE HYPO 22GX1.5 SAFETY (NEEDLE) ×2 IMPLANT
PACK BASIC VI WITH GOWN DISP (CUSTOM PROCEDURE TRAY) ×2 IMPLANT
PANTS MESH DISP LRG (UNDERPADS AND DIAPERS) ×1 IMPLANT
PANTS MESH DISPOSABLE L (UNDERPADS AND DIAPERS) ×1
PENCIL SMOKE EVACUATOR (MISCELLANEOUS) IMPLANT
SOL PREP PROV IODINE SCRUB 4OZ (MISCELLANEOUS) ×2 IMPLANT
SPONGE HEMORRHOID 8X3CM (HEMOSTASIS) IMPLANT
SPONGE SURGIFOAM ABS GEL 100 (HEMOSTASIS) ×2 IMPLANT
SURGILUBE 2OZ TUBE FLIPTOP (MISCELLANEOUS) ×2 IMPLANT
SUT CHROMIC 2 0 SH (SUTURE) IMPLANT
SUT CHROMIC 3 0 SH 27 (SUTURE) IMPLANT
SUT PROLENE 2 0 BLUE (SUTURE) IMPLANT
SUT VIC AB 2-0 SH 27 (SUTURE)
SUT VIC AB 2-0 SH 27X BRD (SUTURE) IMPLANT
SUT VIC AB 2-0 UR6 27 (SUTURE) IMPLANT
SUT VIC AB 3-0 SH 27 (SUTURE) ×5
SUT VIC AB 3-0 SH 27X BRD (SUTURE) ×5 IMPLANT
SUT VIC AB 4-0 SH 18 (SUTURE) IMPLANT
SYR 20ML LL LF (SYRINGE) ×2 IMPLANT
SYR CONTROL 10ML LL (SYRINGE) ×2 IMPLANT
TOWEL OR 17X26 10 PK STRL BLUE (TOWEL DISPOSABLE) ×2 IMPLANT
TOWEL OR NON WOVEN STRL DISP B (DISPOSABLE) ×2 IMPLANT
YANKAUER SUCT BULB TIP NO VENT (SUCTIONS) ×2 IMPLANT

## 2020-11-10 NOTE — Anesthesia Procedure Notes (Signed)
Procedure Name: Intubation Date/Time: 11/10/2020 9:04 AM Performed by: Irving Burton, CRNA Pre-anesthesia Checklist: Patient identified, Emergency Drugs available, Suction available and Patient being monitored Patient Re-evaluated:Patient Re-evaluated prior to induction Oxygen Delivery Method: Circle system utilized Preoxygenation: Pre-oxygenation with 100% oxygen Induction Type: IV induction Ventilation: Mask ventilation without difficulty Laryngoscope Size: Glidescope and 4 Grade View: Grade I Tube type: Oral Number of attempts: 1 Airway Equipment and Method: Rigid stylet and Video-laryngoscopy Placement Confirmation: ETT inserted through vocal cords under direct vision, positive ETCO2 and breath sounds checked- equal and bilateral Secured at: 25 cm Tube secured with: Tape Dental Injury: Teeth and Oropharynx as per pre-operative assessment  Difficulty Due To: Difficulty was anticipated and Difficult Airway- due to anterior larynx

## 2020-11-10 NOTE — H&P (Signed)
Cody Gutierrez is an 40 y.o. male.   Chief Complaint: hemorrhoids HPI: 40 yo male with long history of hemorrhoids that are persistent with irritation and pain.  Past Medical History:  Diagnosis Date   Anxiety    Back fracture    Depression    Migraine    Pneumonia     Past Surgical History:  Procedure Laterality Date   TONSILLECTOMY     WISDOM TOOTH EXTRACTION      Family History  Problem Relation Age of Onset   Migraines Mother    Hypertension Mother    Stroke Mother    Thyroid disease Father    Social History:  reports that he has quit smoking. His smoking use included cigarettes. He has never used smokeless tobacco. He reports current alcohol use. He reports that he does not use drugs.  Allergies:  Allergies  Allergen Reactions   Bee Venom Anaphylaxis   Grape Seed Anaphylaxis   Erythromycin Nausea Only and Other (See Comments)    GI distress    Medications Prior to Admission  Medication Sig Dispense Refill   BuPROPion HBr 522 MG TB24 Take 522 mg by mouth daily.     busPIRone (BUSPAR) 7.5 MG tablet Take 7.5 mg by mouth 2 (two) times daily.     cetirizine (ZYRTEC) 10 MG tablet Take 10 mg by mouth daily.     docusate sodium (COLACE) 100 MG capsule Take 100 mg by mouth 2 (two) times daily.     esomeprazole (NEXIUM) 20 MG capsule Take 20 mg by mouth daily at 12 noon.     lithium carbonate 300 MG capsule Take 300 mg by mouth 3 (three) times daily with meals.     methylphenidate (RITALIN) 20 MG tablet Take 20 mg by mouth 3 (three) times daily with meals.     phenylephrine-shark liver oil-mineral oil-petrolatum (PREPARATION H) 0.25-14-74.9 % rectal ointment Place 1 application rectally 2 (two) times daily as needed for hemorrhoids.     tadalafil (CIALIS) 5 MG tablet Take 5 mg by mouth daily.     tetrahydrozoline 0.05 % ophthalmic solution Place 2 drops into both eyes daily as needed (for dry eyes).      No results found for this or any previous visit (from the past 48  hour(s)). No results found.  Review of Systems  Constitutional:  Negative for chills and fever.  HENT:  Negative for hearing loss.   Respiratory:  Negative for cough.   Cardiovascular:  Negative for chest pain and palpitations.  Gastrointestinal:  Negative for abdominal pain, nausea and vomiting.  Genitourinary:  Negative for dysuria and urgency.  Musculoskeletal:  Negative for myalgias and neck pain.  Skin:  Negative for rash.  Neurological:  Negative for dizziness and headaches.  Hematological:  Does not bruise/bleed easily.  Psychiatric/Behavioral:  Negative for suicidal ideas.    Blood pressure 135/88, pulse 89, temperature 98.2 F (36.8 C), temperature source Oral, resp. rate 16, height 5\' 10"  (1.778 m), weight 95.7 kg, SpO2 100 %. Physical Exam Vitals and nursing note reviewed.  Constitutional:      Appearance: He is well-developed.  HENT:     Head: Normocephalic and atraumatic.  Eyes:     General: No scleral icterus.    Conjunctiva/sclera: Conjunctivae normal.  Cardiovascular:     Rate and Rhythm: Normal rate and regular rhythm.  Pulmonary:     Effort: Pulmonary effort is normal.     Breath sounds: Normal breath sounds. No wheezing or rales.  Chest:     Chest wall: No tenderness.  Abdominal:     General: There is no distension.     Palpations: Abdomen is soft.     Tenderness: There is no abdominal tenderness. There is no rebound.  Musculoskeletal:        General: Normal range of motion.     Cervical back: Normal range of motion and neck supple.  Skin:    General: Skin is warm and dry.  Neurological:     Mental Status: He is alert and oriented to person, place, and time.  Psychiatric:        Behavior: Behavior normal.     Assessment/Plan 40 yo male with hemorrhoids -excisional hemorrhoidectomy -ERAS protocol -outpatient procedure  Rodman Pickle, MD 11/10/2020, 8:50 AM

## 2020-11-10 NOTE — Op Note (Signed)
Preoperative diagnosis: External hemorrhoid  Postoperative diagnosis: same   Procedure: anal exam under anesthesia and excisional hemorrhoidectomy  Surgeon: Feliciana Rossetti, M.D.  Asst: none  Anesthesia: General anesthesia  Indications for procedure: Cody Gutierrez is a 40 y.o. year old male with symptoms of pain and irritation in the perianal area.  Description of procedure: The patient was brought into the operative suite. Anesthesia was administered with general anesthesia. WHO checklist was applied. The patient was then placed in prone position. The area was prepped and draped in the usual sterile fashion.  Next, the area was examined, there was a posterior left hemorrhoid with large interior component. There were no other large hemorrhoid tissue. The hemorrhoid was clamped and excised. The tissue was closed with a running vicryl. There was persistent bleeding so additional vicryl sutures were used to ligate the bleeding. Exparel:Marcaine mix was injected into the perianal space. The space was inspected and hemostatic. A gelfoam was placed into the space. The patient awoke from anesthesia and brought to PACU in stable condition.  Findings: large left posterior hemorrhoid  Specimen: hemorrhoid  Implant: gel foam   Blood loss: 150 ml  Local anesthesia: 97ml of Exparel:Marcaine Mix  Complications: none  Feliciana Rossetti, M.D. General, Bariatric, & Minimally Invasive Surgery [facility]

## 2020-11-10 NOTE — Discharge Instructions (Addendum)
ANORECTAL SURGERY: POST OP INSTRUCTIONS  DIET: Follow a light bland diet the first 24 hours after arrival home, such as soup, liquids, crackers, etc.  Be sure to include lots of fluids daily.  Avoid fast food or heavy meals as your are more likely to get nauseated.  Eat a low fat diet the next few days after surgery.   Some bleeding with bowel movements is expected for the first couple of days but this should stop in between bowel movements  Take your usually prescribed home medications unless otherwise directed.  PAIN CONTROL: It is helpful to take an over-the-counter pain medication regularly for the first few days/weeks.  Choose from the following that works best for you: Ibuprofen (Advil, etc) Three 200mg tabs every 6 hours as needed. Acetaminophen (Tylenol, etc) 500-650mg every 6 hours as needed NOTE: You may take both of these medications together - most patients find it most helpful when alternating between the two (i.e. Ibuprofen at 6am, tylenol at 9am, ibuprofen at 12pm ...) A  prescription for pain medication may have been prescribed for you at discharge.  Take your pain medication as prescribed.  If you are having problems/concerns with the prescription medicine, please call us for further advice.  Avoid getting constipated.  Between the surgery and the pain medications, it is common to experience some constipation.  Increasing fluid intake (64oz of water per day) and taking a fiber supplement (such as Metamucil, Citrucel, FiberCon) 1-2 times a day regularly will usually help prevent this problem from occurring.  Take Miralax (over the counter) 1-2x/day while taking a narcotic pain medication. If no bowel movement after 48hours, you may additionally take a laxative like a bottle of Milk of Magnesia which can be purchased over the counter. Avoid enemas if possible as these are often painful.   Watch out for diarrhea.  If you have many loose bowel movements, simplify your diet to bland  foods.  Stop any stool softeners and decrease your fiber supplement. If this worsens or does not improve, please call us.  Wash / shower every day.  If you were discharged with a dressing, you may remove this the day after your surgery. You may shower normally, getting soap/water on your wound, particularly after bowel movements.  Soaking in a warm bath filled a couple inches ("Sitz bath") is a great way to clean the area after a bowel movement and many patients find it is a way to soothe the area.  ACTIVITIES as tolerated:   You may resume regular (light) daily activities beginning the next day--such as daily self-care, walking, climbing stairs--gradually increasing activities as tolerated.  If you can walk 30 minutes without difficulty, it is safe to try more intense activity such as jogging, treadmill, bicycling, low-impact aerobics, etc. Refrain from any heavy lifting or straining for the first 2 weeks after your procedure, particularly if your surgery was for hemorrhoids. Avoid activities that make your pain worse You may drive when you are no longer taking prescription pain medication, you can comfortably wear a seatbelt, and you can safely maneuver your car and apply brakes.  FOLLOW UP in our office Please call [facility] at [phone] to set up an appointment to see your surgeon in the office for a follow-up appointment approximately 2 weeks after your surgery. Make sure that you call for this appointment the day you arrive home to insure a convenient appointment time.  9. If you have disability or family leave forms that need to be completed,   you may have them completed by your primary care physician's office; for return to work instructions, please ask our office staff and they will be happy to assist you in obtaining this documentation   When to call us [phone]: Poor pain control Reactions / problems with new medications (rash/itching, etc)  Fever over 101.5 F (38.5 C) Inability  to urinate Nausea/vomiting Worsening swelling or bruising Continued bleeding from incision. Increased pain, redness, or drainage from the incision  The clinic staff is available to answer your questions during regular business hours (8:30am-5pm).  Please don't hesitate to call and ask to speak to one of our nurses for clinical concerns.   A surgeon from [facility] is always on call at the hospitals   If you have a medical emergency, go to the nearest emergency room or call 911.   [facility] [address], [address], [place], [place]  [address] ? MAIN: [phone] FAX [phone] [email or web address]  

## 2020-11-10 NOTE — Transfer of Care (Signed)
Immediate Anesthesia Transfer of Care Note  Patient: Cody Gutierrez  Procedure(s) Performed: HEMORRHOIDECTOMY (Anus)  Patient Location: PACU  Anesthesia Type:General  Level of Consciousness: drowsy  Airway & Oxygen Therapy: Patient Spontanous Breathing and Patient connected to face mask oxygen  Post-op Assessment: Report given to RN and Post -op Vital signs reviewed and stable  Post vital signs: Reviewed and stable  Last Vitals:  Vitals Value Taken Time  BP 143/88 11/10/20 1005  Temp 35.9 C 11/10/20 1005  Pulse 88 11/10/20 1011  Resp 11 11/10/20 1011  SpO2 94 % 11/10/20 1011  Vitals shown include unvalidated device data.  Last Pain:  Vitals:   11/10/20 1005  TempSrc:   PainSc: Asleep      Patients Stated Pain Goal: 5 (11/10/20 0700)  Complications: No notable events documented.

## 2020-11-10 NOTE — Anesthesia Postprocedure Evaluation (Signed)
Anesthesia Post Note  Patient: Cody Gutierrez  Procedure(s) Performed: HEMORRHOIDECTOMY (Anus)     Patient location during evaluation: PACU Anesthesia Type: General Level of consciousness: awake and alert Pain management: pain level controlled Vital Signs Assessment: post-procedure vital signs reviewed and stable Respiratory status: spontaneous breathing, nonlabored ventilation, respiratory function stable and patient connected to nasal cannula oxygen Cardiovascular status: blood pressure returned to baseline and stable Postop Assessment: no apparent nausea or vomiting Anesthetic complications: no   No notable events documented.  Last Vitals:  Vitals:   11/10/20 1010 11/10/20 1015  BP:  (!) 143/85  Pulse: 96 (!) 102  Resp: 15 16  Temp:    SpO2: 93% 100%    Last Pain:  Vitals:   11/10/20 1022  TempSrc:   PainSc: 8                  Trevor Iha

## 2020-11-11 ENCOUNTER — Encounter (HOSPITAL_COMMUNITY): Payer: Self-pay | Admitting: General Surgery

## 2020-11-11 LAB — SURGICAL PATHOLOGY

## 2021-01-18 ENCOUNTER — Other Ambulatory Visit: Payer: Self-pay

## 2021-01-18 ENCOUNTER — Emergency Department (HOSPITAL_COMMUNITY)
Admission: EM | Admit: 2021-01-18 | Discharge: 2021-01-18 | Disposition: A | Discharge status: Home / Self Care | Payer: No Typology Code available for payment source | Attending: Emergency Medicine | Admitting: Emergency Medicine

## 2021-01-18 DIAGNOSIS — Z23 Encounter for immunization: Secondary | ICD-10-CM | POA: Diagnosis not present

## 2021-01-18 DIAGNOSIS — Z87891 Personal history of nicotine dependence: Secondary | ICD-10-CM | POA: Diagnosis not present

## 2021-01-18 DIAGNOSIS — W260XXA Contact with knife, initial encounter: Secondary | ICD-10-CM | POA: Insufficient documentation

## 2021-01-18 DIAGNOSIS — S61111A Laceration without foreign body of right thumb with damage to nail, initial encounter: Secondary | ICD-10-CM | POA: Diagnosis not present

## 2021-01-18 DIAGNOSIS — S6991XA Unspecified injury of right wrist, hand and finger(s), initial encounter: Secondary | ICD-10-CM | POA: Diagnosis present

## 2021-01-18 MED ORDER — TETANUS-DIPHTH-ACELL PERTUSSIS 5-2.5-18.5 LF-MCG/0.5 IM SUSY
0.5000 mL | PREFILLED_SYRINGE | Freq: Once | INTRAMUSCULAR | Status: AC
  Administered 2021-01-18: 0.5 mL via INTRAMUSCULAR
  Filled 2021-01-18: qty 0.5

## 2021-01-18 NOTE — Discharge Instructions (Signed)
You were evaluated in the Emergency Department and after careful evaluation, we did not find any emergent condition requiring admission or further testing in the hospital.  Your exam/testing today was overall reassuring.  The Steri-Strips and medical adhesive will wear away slowly with time and fall off.  Try not to soak the area.  Recommend a bulky dressing and minimal use of the thumb for the next 2 days.  Please return to the Emergency Department if you experience any worsening of your condition.  Thank you for allowing [place] to be a part of your care.

## 2021-01-18 NOTE — ED Triage Notes (Signed)
Pt arrived via [facility] as member of EMS employee on shift for laceration to right thumb. Pt reports using pocket knife when locking mechanism failed and closed, cutting thumb. Pt reports 4/10.

## 2021-01-18 NOTE — ED Notes (Addendum)
[employer] urine specimen collected by [employer]

## 2021-01-18 NOTE — ED Notes (Signed)
Laceration cleaned/soaked in soap and water, dermabond and steristrips applied by Dr. Pilar Plate, and wound dressed.

## 2021-01-18 NOTE — ED Provider Notes (Signed)
[facility] Emergency Department Provider Note MRN:  [id number]  Arrival date & time: 01/18/21     Chief Complaint   Laceration   History of Present Illness   Cody Gutierrez is a 40 y.o. year-old male with no pertinent past medical history presenting to the ED with chief complaint of laceration.  Location: Right thumb Duration: Couple hours ago Onset: Sudden Timing: Constant pain Description: Stinging Severity: Mild Exacerbating/Alleviating Factors: Worse with motion or palpation Associated Symptoms: None Pertinent Negatives: Denies any other injuries  Additional History: Sustained a laceration to the finger while closing a pocket knife  Review of Systems  A complete 10 system review of systems was obtained and all systems are negative except as noted in the HPI and PMH.   Patient's Health History    Past Medical History:  Diagnosis Date   Anxiety    Back fracture    Depression    Migraine    Pneumonia     Past Surgical History:  Procedure Laterality Date   HEMORRHOID SURGERY N/A 11/10/2020   Procedure: HEMORRHOIDECTOMY;  Surgeon: Kinsinger, De Blanch, MD;  Location: [facility] ORS;  Service: General;  Laterality: N/A;   TONSILLECTOMY     WISDOM TOOTH EXTRACTION      Family History  Problem Relation Age of Onset   Migraines Mother    Hypertension Mother    Stroke Mother    Thyroid disease Father     Social History   Socioeconomic History   Marital status: Married    Spouse name: Not on file   Number of children: Not on file   Years of education: Not on file   Highest education level: Not on file  Occupational History   Not on file  Tobacco Use   Smoking status: Former    Years: 15.00    Types: Cigarettes   Smokeless tobacco: Never  Vaping Use   Vaping Use: Every day   Substances: Nicotine  Substance and Sexual Activity   Alcohol use: Yes    Comment: rarelly   Drug use: No   Sexual activity: Not on file  Other Topics Concern    Not on file  Social History Narrative   Not on file   Social Determinants of Health   Financial Resource Strain: Not on file  Food Insecurity: Not on file  Transportation Needs: Not on file  Physical Activity: Not on file  Stress: Not on file  Social Connections: Not on file  Intimate Partner Violence: Not on file     Physical Exam   Vitals:   01/18/21 0133  BP: (!) 143/95  Pulse: 99  Resp: 18  Temp: 98.6 F (37 C)  SpO2: 100%    CONSTITUTIONAL: Well-appearing, NAD NEURO:  Alert and oriented x 3, no focal deficits EYES:  eyes equal and reactive ENT/NECK:  no LAD, no JVD CARDIO: Regular rate, well-perfused, normal S1 and S2 PULM:  CTAB no wheezing or rhonchi GI/GU:  normal bowel sounds, non-distended, non-tender MSK/SPINE:  No gross deformities, no edema SKIN: Laceration to the distal tip of the right thumb PSYCH:  Appropriate speech and behavior  *Additional and/or pertinent findings included in MDM below  Diagnostic and Interventional Summary    EKG Interpretation  Date/Time:    Ventricular Rate:    PR Interval:    QRS Duration:   QT Interval:    QTC Calculation:   R Axis:     Text Interpretation:  Labs Reviewed - No data to display  No orders to display    Medications  Tdap (BOOSTRIX) injection 0.5 mL (0.5 mLs Intramuscular Given 01/18/21 0241)     Procedures  /  Critical Care .Marland KitchenLaceration Repair  Date/Time: 01/18/2021 2:44 AM Performed by: Sabas Sous, MD Authorized by: Sabas Sous, MD   Consent:    Consent obtained:  Verbal   Consent given by:  Patient   Risks, benefits, and alternatives were discussed: yes     Risks discussed:  Infection, need for additional repair, nerve damage, poor wound healing, poor cosmetic result, pain, vascular damage, retained foreign body and tendon damage Universal protocol:    Procedure explained and questions answered to patient or proxy's satisfaction: yes     Immediately prior to procedure, a  time out was called: yes     Patient identity confirmed:  Verbally with patient Anesthesia:    Anesthesia method:  None Laceration details:    Location:  Finger   Finger location:  R thumb   Length (cm):  1   Depth (mm):  2 Pre-procedure details:    Preparation:  Patient was prepped and draped in usual sterile fashion Exploration:    Limited defect created (wound extended): no     Hemostasis achieved with:  Direct pressure   Wound exploration: wound explored through full range of motion     Contaminated: no   Treatment:    Area cleansed with:  Soap and water Skin repair:    Repair method:  Tissue adhesive and Steri-Strips   Number of Steri-Strips:  4 Approximation:    Approximation:  Close Repair type:    Repair type:  Simple Post-procedure details:    Dressing:  Open (no dressing)   Procedure completion:  Tolerated well, no immediate complications  ED Course and Medical Decision Making  I have reviewed the triage vital signs, the nursing notes, and pertinent available records from the EMR.  Listed above are laboratory and imaging tests that I personally ordered, reviewed, and interpreted and then considered in my medical decision making (see below for details).  Wound is well approximated, minimal involvement of the fingernail.  Cleaning thoroughly with a soap and water soak, plan is to Dermabond.  No other injuries, will update tetanus.       Elmer Sow. Pilar Plate, MD [facility] Emergency Medicine [facility] [email or web address]  Final Clinical Impressions(s) / ED Diagnoses     ICD-10-CM   1. Laceration of right thumb with damage to nail, foreign body presence unspecified, initial encounter  Z61.096E       ED Discharge Orders     None        Discharge Instructions Discussed with and Provided to Patient:     Discharge Instructions      You were evaluated in the Emergency Department and after careful evaluation, we did not find any emergent  condition requiring admission or further testing in the hospital.  Your exam/testing today was overall reassuring.  The Steri-Strips and medical adhesive will wear away slowly with time and fall off.  Try not to soak the area.  Recommend a bulky dressing and minimal use of the thumb for the next 2 days.  Please return to the Emergency Department if you experience any worsening of your condition.  Thank you for allowing [place] to be a part of your care.         Sabas Sous, MD 01/18/21 [phone]

## 2022-01-22 ENCOUNTER — Other Ambulatory Visit: Payer: Self-pay | Admitting: Nurse Practitioner

## 2022-01-22 ENCOUNTER — Ambulatory Visit: Payer: Self-pay

## 2022-01-22 DIAGNOSIS — R52 Pain, unspecified: Secondary | ICD-10-CM

## 2022-07-07 ENCOUNTER — Emergency Department (HOSPITAL_COMMUNITY)
Admission: EM | Admit: 2022-07-07 | Discharge: 2022-07-07 | Disposition: A | Discharge status: Home / Self Care | Payer: No Typology Code available for payment source | Attending: Emergency Medicine | Admitting: Emergency Medicine

## 2022-07-07 ENCOUNTER — Other Ambulatory Visit: Payer: Self-pay

## 2022-07-07 DIAGNOSIS — Z7721 Contact with and (suspected) exposure to potentially hazardous body fluids: Secondary | ICD-10-CM | POA: Diagnosis present

## 2022-07-07 DIAGNOSIS — I10 Essential (primary) hypertension: Secondary | ICD-10-CM | POA: Diagnosis not present

## 2022-07-07 NOTE — Discharge Instructions (Signed)
Follow with your PCP for recheck of blood pressure

## 2022-07-07 NOTE — ED Triage Notes (Signed)
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

## 2022-07-07 NOTE — ED Provider Notes (Signed)
[place] EMERGENCY DEPARTMENT AT [facility] Provider Note   CSN: [id number] Arrival date & time: 07/07/22  0200     History  Chief Complaint  Patient presents with   Blood Exposure    Arwin Bisceglia is a 42 y.o. male.  Was exposed to blood from a patient in the ED who is having HIV/Hep being drawn. Patient with no complaints and only here for tracking purposes.         Home Medications Prior to Admission medications   Medication Sig Start Date End Date Taking? Authorizing Provider  BuPROPion HBr 522 MG TB24 Take 522 mg by mouth daily.    [provider]  busPIRone (BUSPAR) 7.5 MG tablet Take 7.5 mg by mouth 2 (two) times daily.    [provider]  cetirizine (ZYRTEC) 10 MG tablet Take 10 mg by mouth daily.    [provider]  docusate sodium (COLACE) 100 MG capsule Take 100 mg by mouth 2 (two) times daily.    [provider]  esomeprazole (NEXIUM) 20 MG capsule Take 20 mg by mouth daily at 12 noon.    [provider]  ibuprofen (ADVIL) 800 MG tablet Take 1 tablet (800 mg total) by mouth every 8 (eight) hours as needed. 11/10/20   Kinsinger, De Blanch, MD  lithium carbonate 300 MG capsule Take 300 mg by mouth 3 (three) times daily with meals.    [provider]  methylphenidate (RITALIN) 20 MG tablet Take 20 mg by mouth 3 (three) times daily with meals.    [provider]  oxyCODONE (OXY IR/ROXICODONE) 5 MG immediate release tablet Take 1 tablet (5 mg total) by mouth every 6 (six) hours as needed for severe pain. 11/10/20   Kinsinger, De Blanch, MD  phenylephrine-shark liver oil-mineral oil-petrolatum (PREPARATION H) 0.25-14-74.9 % rectal ointment Place 1 application rectally 2 (two) times daily as needed for hemorrhoids.    [provider]  tadalafil (CIALIS) 5 MG tablet Take 5 mg by mouth daily.    [provider]  tetrahydrozoline 0.05 % ophthalmic solution Place 2 drops into both eyes  daily as needed (for dry eyes).    [provider]      Allergies    Bee venom, Grape seed, and Erythromycin    Review of Systems   Review of Systems  Physical Exam Updated Vital Signs BP (!) 152/94 (BP Location: Right Arm)   Pulse 91   Temp 98.3 F (36.8 C) (Oral)   Resp 18   SpO2 97%  Physical Exam Vitals and nursing note reviewed.  Constitutional:      Appearance: He is well-developed.  HENT:     Head: Normocephalic and atraumatic.  Eyes:     Pupils: Pupils are equal, round, and reactive to light.  Cardiovascular:     Rate and Rhythm: Normal rate.  Pulmonary:     Effort: Pulmonary effort is normal. No respiratory distress.  Abdominal:     General: There is no distension.  Musculoskeletal:        General: Normal range of motion.     Cervical back: Normal range of motion.  Skin:    General: Skin is warm and dry.  Neurological:     General: No focal deficit present.     Mental Status: He is alert.     ED Results / Procedures / Treatments   Labs (all labs ordered are listed, but only abnormal results are displayed) Labs Reviewed - No data  to display  EKG None  Radiology No results found.  Procedures Procedures    Medications Ordered in ED Medications - No data to display  ED Course/ Medical Decision Making/ A&P                             Medical Decision Making  Mild HTN, maybe situational. Will fu w/ PCP for recheck. No complaints.    Final Clinical Impression(s) / ED Diagnoses Final diagnoses:  Exposure to blood or body fluid  Hypertension, unspecified type    Rx / DC Orders ED Discharge Orders     None         Malkia Nippert, Barbara Cower, MD 07/07/22 [phone]
